# Patient Record
Sex: Female | Born: 1960 | Race: White | Hispanic: No | Marital: Married | Smoking: Never smoker
Health system: Southern US, Community
[De-identification: ages and names within clinical notes are randomized; demographics above are authoritative.]

## PROBLEM LIST (undated history)

## (undated) DIAGNOSIS — I10 Essential (primary) hypertension: Secondary | ICD-10-CM

## (undated) DIAGNOSIS — K589 Irritable bowel syndrome without diarrhea: Secondary | ICD-10-CM

## (undated) DIAGNOSIS — L0390 Cellulitis, unspecified: Secondary | ICD-10-CM

## (undated) DIAGNOSIS — I499 Cardiac arrhythmia, unspecified: Secondary | ICD-10-CM

## (undated) DIAGNOSIS — M797 Fibromyalgia: Secondary | ICD-10-CM

## (undated) DIAGNOSIS — N3281 Overactive bladder: Secondary | ICD-10-CM

## (undated) DIAGNOSIS — G4730 Sleep apnea, unspecified: Secondary | ICD-10-CM

## (undated) DIAGNOSIS — K219 Gastro-esophageal reflux disease without esophagitis: Secondary | ICD-10-CM

## (undated) HISTORY — PX: VEIN SURGERY: SHX48

## (undated) HISTORY — PX: POLYPECTOMY: SHX149

## (undated) HISTORY — PX: DILATION AND CURETTAGE OF UTERUS: SHX78

## (undated) HISTORY — PX: FRACTURE SURGERY: SHX138

## (undated) HISTORY — PX: NOVASURE ABLATION: SHX5394

---

## 2004-05-16 ENCOUNTER — Ambulatory Visit (HOSPITAL_COMMUNITY): Admission: RE | Admit: 2004-05-16 | Discharge: 2004-05-16 | Payer: Self-pay | Admitting: Family Medicine

## 2006-08-31 ENCOUNTER — Ambulatory Visit: Payer: Self-pay | Admitting: Cardiovascular Disease

## 2006-09-03 ENCOUNTER — Ambulatory Visit: Payer: Self-pay

## 2008-09-26 ENCOUNTER — Ambulatory Visit (HOSPITAL_COMMUNITY): Admission: RE | Admit: 2008-09-26 | Discharge: 2008-09-26 | Payer: Self-pay | Admitting: Family Medicine

## 2009-06-14 ENCOUNTER — Ambulatory Visit: Payer: Self-pay | Admitting: Licensed Clinical Social Worker

## 2009-07-05 ENCOUNTER — Emergency Department (HOSPITAL_COMMUNITY): Admission: EM | Admit: 2009-07-05 | Discharge: 2009-07-05 | Payer: Self-pay | Admitting: Emergency Medicine

## 2010-01-12 ENCOUNTER — Emergency Department (HOSPITAL_COMMUNITY): Admission: EM | Admit: 2010-01-12 | Discharge: 2010-01-12 | Payer: Self-pay | Admitting: Emergency Medicine

## 2010-01-16 ENCOUNTER — Telehealth (INDEPENDENT_AMBULATORY_CARE_PROVIDER_SITE_OTHER): Payer: Self-pay | Admitting: *Deleted

## 2010-06-25 ENCOUNTER — Ambulatory Visit (HOSPITAL_COMMUNITY): Admission: RE | Admit: 2010-06-25 | Discharge: 2010-06-25 | Payer: Self-pay | Admitting: Family Medicine

## 2010-11-16 ENCOUNTER — Encounter: Payer: Self-pay | Admitting: Family Medicine

## 2010-11-26 NOTE — Progress Notes (Signed)
  Faxed Lov,Stress over to [facility]/ [facility] to [phone] [facility]  January 16, 2010 1:10 PM

## 2011-01-30 LAB — DIFFERENTIAL
Eosinophils Relative: 2 % (ref 0–5)
Lymphocytes Relative: 34 % (ref 12–46)
Lymphs Abs: 2.6 10*3/uL (ref 0.7–4.0)
Monocytes Relative: 6 % (ref 3–12)
Neutrophils Relative %: 59 % (ref 43–77)

## 2011-01-30 LAB — COMPREHENSIVE METABOLIC PANEL
ALT: 21 U/L (ref 0–35)
AST: 24 U/L (ref 0–37)
Albumin: 3.7 g/dL (ref 3.5–5.2)
Alkaline Phosphatase: 46 U/L (ref 39–117)
BUN: 8 mg/dL (ref 6–23)
GFR calc non Af Amer: >60 mL/min (ref >60)
Total Bilirubin: 0.3 mg/dL (ref 0.3–1.2)

## 2011-01-30 LAB — CBC
MCHC: 34.2 g/dL (ref 30.0–36.0)
Platelets: 237 10*3/uL (ref 150–400)
WBC: 7.7 10*3/uL (ref 4.0–10.5)

## 2011-01-30 LAB — POCT CARDIAC MARKERS
CKMB, poc: <1 ng/mL — ABNORMAL LOW (ref 1.0–8.0)
Myoglobin, poc: 92.6 ng/mL (ref 12–200)
Troponin i, poc: <0.05 ng/mL (ref 0.00–0.09)

## 2011-03-13 NOTE — H&P (Signed)
[facility] ADMISSION   Lisa Braun, Lisa Braun                      MRN:          [id number]  DATE:08/31/2006                            DOB:          1961/05/08    Lisa Braun is seen today in as a new consult.  She was referred by Dr. Morrie Sheldon from  [facility].  She is 51 years old.  I took care of  her father 4-5 years ago.  He had coronary artery bypass surgery and died of  a myocardial infarction.   The patient is referred for chest pain.   She had a severe episode a week ago Monday.  It was nonexertional.  It  resolved on its own.  There was some radiation to both arms and her neck.  She can get atypical chest pain from time-to-time.  She does not get  reproducible exertional pain.  She does have fibromyalgia, but this tends to  worsen in her arms and legs, more than anything else.  Her cardiac risk  factors include positive family history and smoking.  She smokes about a  pack a day.  She is a nondiabetic.  I do not know her cholesterol status.   REVIEW OF SYSTEMS:  Remarkable for no pleurisy, no cough, and no sputum.  No  previous trauma and no syncope.  She does get occasional palpitations.  Other review of systems are remarkable for a small amount of menstrual  bleeding.  She may be going through menopause.  She has reflux.  She has  general fatigue from her fibromyalgia.   Her mother died at age 26 of a stroke.  Her father died of renal failure and  diabetes, but had significant heart problems.  She takes Ultram and  Ditropan.  She has recently been on Bactrim for cellulitis and lower  extremity edema.   She does take an aspirin a day and p.r.n. Tylenol.   Of note, the patient does not have health insurance.  She has been married  for 10 years.  There are two children living in the house who are good kids  and from previous marriages on both sides.  The patient works at [employer]  and  enjoys raising boxers.   ALLERGIES:  She has no significant allergies.   PHYSICAL EXAMINATION:  SKIN:  Warm and dry.  HEENT:  Normal.  There is no lymphadenopathy.  There is no thyromegaly.  LUNGS:  Clear.  Carotids are normal.  There is an S1-S2, normal heart sounds.  ABDOMEN:  Benign.  Lower extremity intact pulses.  No edema.  NEUROLOGIC:  Nonfocal.   EKG is essentially normal, previously, in Dr. Nelly Laurence office.  I suspect that  they had her chest leads too high because the EKG read previous anterior  wall infarction.   IMPRESSION:  Atypical chest pain in a long-term smoker.  I had a long  discussion with the patient.  Because she does not have health insurance we  would like to minimize cost.  Unfortunately she has some nonspecific ST-T  wave  changes on her EKG and being a fairly young woman I think the risk of  an abnormal treadmill is quite high, plus because of her fibromyalgia I do  not think that she could walk on the treadmill well.  She is willing to pay  out of pocket; and, I think, the cheapest test would be an adenosine only  Myoview study.  We will set this up and I will see her back in 2 months.   I told her to take an aspirin a day.  She was offered Chantex medicine, but  again, because of cost reasons could not afford it; and I believe that she  has been on Wellbutrin in the past and failed.  I will see her back in about  2 months unless her stress test is abnormal.    ______________________________  Noralyn Pick. Eden Emms, MD, [facility]    PCN/MedQ  DD: 08/31/2006  DT: 08/31/2006  Job #: [id number]   cc:   Alfredia Client, MD

## 2013-09-29 ENCOUNTER — Other Ambulatory Visit: Payer: Self-pay | Admitting: Obstetrics

## 2013-10-12 ENCOUNTER — Encounter (HOSPITAL_COMMUNITY): Payer: Self-pay

## 2013-10-13 ENCOUNTER — Other Ambulatory Visit (HOSPITAL_COMMUNITY): Payer: 59

## 2013-10-16 ENCOUNTER — Encounter (HOSPITAL_COMMUNITY): Payer: Self-pay

## 2013-10-16 ENCOUNTER — Encounter (HOSPITAL_COMMUNITY)
Admission: RE | Admit: 2013-10-16 | Discharge: 2013-10-16 | Disposition: A | Discharge status: Home / Self Care | Payer: 59 | Source: Ambulatory Visit | Attending: Obstetrics | Admitting: Obstetrics

## 2013-10-16 DIAGNOSIS — Z01812 Encounter for preprocedural laboratory examination: Secondary | ICD-10-CM | POA: Insufficient documentation

## 2013-10-16 HISTORY — DX: Irritable bowel syndrome, unspecified: K58.9

## 2013-10-16 HISTORY — DX: Fibromyalgia: M79.7

## 2013-10-16 HISTORY — DX: Cardiac arrhythmia, unspecified: I49.9

## 2013-10-16 HISTORY — DX: Gastro-esophageal reflux disease without esophagitis: K21.9

## 2013-10-16 HISTORY — DX: Cellulitis, unspecified: L03.90

## 2013-10-16 HISTORY — DX: Overactive bladder: N32.81

## 2013-10-16 HISTORY — DX: Essential (primary) hypertension: I10

## 2013-10-16 LAB — COMPREHENSIVE METABOLIC PANEL
ALT: 40 U/L — ABNORMAL HIGH (ref 0–35)
AST: 31 U/L (ref 0–37)
Albumin: 3.7 g/dL (ref 3.5–5.2)
CO2: 31 mEq/L (ref 19–32)
Chloride: 100 mEq/L (ref 96–112)
GFR calc non Af Amer: >90 mL/min (ref >90)
Potassium: 3.8 mEq/L (ref 3.5–5.1)
Sodium: 137 mEq/L (ref 135–145)
Total Bilirubin: 0.3 mg/dL (ref 0.3–1.2)

## 2013-10-16 LAB — TYPE AND SCREEN: ABO/RH(D): B POS

## 2013-10-16 LAB — CBC
MCH: 29.4 pg (ref 26.0–34.0)
MCV: 90.2 fL (ref 78.0–100.0)
WBC: 5.6 10*3/uL (ref 4.0–10.5)

## 2013-10-16 LAB — ABO/RH: ABO/RH(D): B POS

## 2013-10-16 NOTE — Patient Instructions (Signed)
Your procedure is scheduled on: 10/25/2013  Enter through the Main Entrance of [facility] at: 1030AM  Pick up the phone at the desk and dial [phone].  Call this number if you have problems the morning of surgery: [phone].  Remember: Do NOT eat food: AFTER MIDNIGHT TUESDAY Do NOT drink clear liquids after:  AFTER MIDNIGHT TUESDAY Take these medicines the morning of surgery with a SIP OF WATER: HYDROCHLOROTHIAZIDE, PRILOSEC  Do NOT wear jewelry (body piercing), make-up, or nail polish. Do NOT wear lotions, powders, or perfumes.  You may wear deoderant. Do NOT shave for 48 hours prior to surgery. Do NOT bring valuables to the hospital. Contacts, dentures, or bridgework may not be worn into surgery. Leave suitcase in car.  After surgery it may be brought to your room.  For patients admitted to the hospital, checkout time is 11:00 AM the day of discharge.

## 2013-10-23 ENCOUNTER — Ambulatory Visit: Admit: 2013-10-23 | Payer: Self-pay | Admitting: Obstetrics and Gynecology

## 2013-10-23 SURGERY — HYSTERECTOMY, VAGINAL, LAPAROSCOPY-ASSISTED
Anesthesia: General

## 2013-10-24 ENCOUNTER — Other Ambulatory Visit (HOSPITAL_COMMUNITY): Payer: Self-pay | Admitting: Obstetrics

## 2013-10-24 DIAGNOSIS — Z1231 Encounter for screening mammogram for malignant neoplasm of breast: Secondary | ICD-10-CM

## 2013-10-25 ENCOUNTER — Encounter (HOSPITAL_COMMUNITY)
Admission: RE | Disposition: A | Discharge status: Home / Self Care | Payer: Self-pay | Source: Ambulatory Visit | Attending: Obstetrics

## 2013-10-25 ENCOUNTER — Ambulatory Visit (HOSPITAL_COMMUNITY): Payer: 59 | Admitting: Certified Registered"

## 2013-10-25 ENCOUNTER — Encounter (HOSPITAL_COMMUNITY): Payer: Self-pay | Admitting: Certified Registered"

## 2013-10-25 ENCOUNTER — Ambulatory Visit (HOSPITAL_COMMUNITY)
Admission: RE | Admit: 2013-10-25 | Discharge: 2013-10-26 | Disposition: A | Discharge status: Home / Self Care | Payer: 59 | Source: Ambulatory Visit | Attending: Obstetrics | Admitting: Obstetrics

## 2013-10-25 ENCOUNTER — Encounter (HOSPITAL_COMMUNITY): Payer: 59 | Admitting: Certified Registered"

## 2013-10-25 DIAGNOSIS — N938 Other specified abnormal uterine and vaginal bleeding: Secondary | ICD-10-CM | POA: Insufficient documentation

## 2013-10-25 DIAGNOSIS — N949 Unspecified condition associated with female genital organs and menstrual cycle: Secondary | ICD-10-CM | POA: Insufficient documentation

## 2013-10-25 DIAGNOSIS — Z90710 Acquired absence of both cervix and uterus: Secondary | ICD-10-CM | POA: Diagnosis present

## 2013-10-25 DIAGNOSIS — Z8041 Family history of malignant neoplasm of ovary: Secondary | ICD-10-CM | POA: Insufficient documentation

## 2013-10-25 DIAGNOSIS — IMO0001 Reserved for inherently not codable concepts without codable children: Secondary | ICD-10-CM | POA: Insufficient documentation

## 2013-10-25 HISTORY — PX: ROBOTIC ASSISTED TOTAL HYSTERECTOMY WITH BILATERAL SALPINGO OOPHERECTOMY: SHX6086

## 2013-10-25 HISTORY — PX: LAPAROSCOPIC ASSISTED VAGINAL HYSTERECTOMY: SHX5398

## 2013-10-25 LAB — TYPE AND SCREEN
ABO/RH(D): B POS
Antibody Screen: NEGATIVE

## 2013-10-25 SURGERY — ROBOTIC ASSISTED TOTAL HYSTERECTOMY WITH BILATERAL SALPINGO OOPHORECTOMY
Anesthesia: General

## 2013-10-25 MED ORDER — DEXAMETHASONE SODIUM PHOSPHATE 10 MG/ML IJ SOLN
INTRAMUSCULAR | Status: AC
  Filled 2013-10-25: qty 1

## 2013-10-25 MED ORDER — CEFAZOLIN SODIUM-DEXTROSE 2-3 GM-% IV SOLR: 2.0000 g | INTRAVENOUS | Status: DC

## 2013-10-25 MED ORDER — SODIUM CHLORIDE 0.9 % IJ SOLN
INTRAMUSCULAR | Status: AC
  Filled 2013-10-25: qty 10

## 2013-10-25 MED ORDER — GLYCOPYRROLATE 0.2 MG/ML IJ SOLN
INTRAMUSCULAR | Status: AC
  Filled 2013-10-25: qty 3

## 2013-10-25 MED ORDER — MEPERIDINE HCL 25 MG/ML IJ SOLN: 6.2500 mg | INTRAMUSCULAR | Status: DC | PRN

## 2013-10-25 MED ORDER — ONDANSETRON HCL 4 MG/2ML IJ SOLN: 4.0000 mg | Freq: Four times a day (QID) | INTRAMUSCULAR | Status: DC | PRN

## 2013-10-25 MED ORDER — BUPIVACAINE HCL (PF) 0.25 % IJ SOLN
INTRAMUSCULAR | Status: DC | PRN
  Administered 2013-10-25: 30 mL

## 2013-10-25 MED ORDER — IBUPROFEN 600 MG PO TABS: 600.0000 mg | ORAL_TABLET | Freq: Four times a day (QID) | ORAL | Status: DC | PRN

## 2013-10-25 MED ORDER — LIDOCAINE HCL (CARDIAC) 20 MG/ML IV SOLN
INTRAVENOUS | Status: AC
  Filled 2013-10-25: qty 5

## 2013-10-25 MED ORDER — NEOSTIGMINE METHYLSULFATE 1 MG/ML IJ SOLN
INTRAMUSCULAR | Status: DC | PRN
  Administered 2013-10-25: 3 mg via INTRAVENOUS

## 2013-10-25 MED ORDER — PROPOFOL 10 MG/ML IV BOLUS
INTRAVENOUS | Status: DC | PRN
  Administered 2013-10-25: 180 mg via INTRAVENOUS
  Administered 2013-10-25 (×2): 50 mg via INTRAVENOUS

## 2013-10-25 MED ORDER — MIDAZOLAM HCL 2 MG/2ML IJ SOLN
INTRAMUSCULAR | Status: DC | PRN
  Administered 2013-10-25: 2 mg via INTRAVENOUS

## 2013-10-25 MED ORDER — HYDROMORPHONE HCL PF 1 MG/ML IJ SOLN: 1.0000 mg | INTRAMUSCULAR | Status: DC | PRN

## 2013-10-25 MED ORDER — ROPIVACAINE HCL 5 MG/ML IJ SOLN
INTRAMUSCULAR | Status: AC
  Filled 2013-10-25: qty 30

## 2013-10-25 MED ORDER — GLYCOPYRROLATE 0.2 MG/ML IJ SOLN
INTRAMUSCULAR | Status: DC | PRN
  Administered 2013-10-25: .4 mg via INTRAVENOUS

## 2013-10-25 MED ORDER — SODIUM CHLORIDE 0.9 % IJ SOLN
INTRAMUSCULAR | Status: DC | PRN
  Administered 2013-10-25: 60 mL via INTRAVENOUS

## 2013-10-25 MED ORDER — ZOLPIDEM TARTRATE 5 MG PO TABS: 5.0000 mg | ORAL_TABLET | Freq: Every evening | ORAL | Status: DC | PRN

## 2013-10-25 MED ORDER — LIDOCAINE-EPINEPHRINE 0.5 %-1:200000 IJ SOLN
INTRAMUSCULAR | Status: AC
  Filled 2013-10-25: qty 1

## 2013-10-25 MED ORDER — MENTHOL 3 MG MT LOZG: 1.0000 | LOZENGE | OROMUCOSAL | Status: DC | PRN

## 2013-10-25 MED ORDER — PANTOPRAZOLE SODIUM 40 MG PO TBEC
40.0000 mg | DELAYED_RELEASE_TABLET | Freq: Every day | ORAL | Status: DC
  Administered 2013-10-26: 40 mg via ORAL
  Filled 2013-10-25 (×2): qty 1

## 2013-10-25 MED ORDER — ROCURONIUM BROMIDE 100 MG/10ML IV SOLN
INTRAVENOUS | Status: DC | PRN
  Administered 2013-10-25: 10 mg via INTRAVENOUS
  Administered 2013-10-25: 20 mg via INTRAVENOUS
  Administered 2013-10-25: 50 mg via INTRAVENOUS
  Administered 2013-10-25 (×2): 10 mg via INTRAVENOUS

## 2013-10-25 MED ORDER — ROCURONIUM BROMIDE 100 MG/10ML IV SOLN
INTRAVENOUS | Status: AC
  Filled 2013-10-25: qty 1

## 2013-10-25 MED ORDER — ONDANSETRON HCL 4 MG/2ML IJ SOLN
INTRAMUSCULAR | Status: DC | PRN
  Administered 2013-10-25: 4 mg via INTRAVENOUS

## 2013-10-25 MED ORDER — GENTAMICIN SULFATE 40 MG/ML IJ SOLN
Freq: Once | INTRAVENOUS | Status: AC
  Administered 2013-10-25: 100 mL via INTRAVENOUS
  Filled 2013-10-25: qty 8.75

## 2013-10-25 MED ORDER — GENTAMICIN SULFATE 40 MG/ML IJ SOLN: 5.0000 mg/kg | Freq: Once | INTRAVENOUS | Status: DC

## 2013-10-25 MED ORDER — OXYBUTYNIN CHLORIDE 5 MG PO TABS
5.0000 mg | ORAL_TABLET | Freq: Two times a day (BID) | ORAL | Status: DC
  Filled 2013-10-25 (×4): qty 1

## 2013-10-25 MED ORDER — BUPIVACAINE HCL (PF) 0.25 % IJ SOLN
INTRAMUSCULAR | Status: AC
  Filled 2013-10-25: qty 60

## 2013-10-25 MED ORDER — ONDANSETRON HCL 4 MG/2ML IJ SOLN
INTRAMUSCULAR | Status: AC
  Filled 2013-10-25: qty 2

## 2013-10-25 MED ORDER — KETOROLAC TROMETHAMINE 30 MG/ML IJ SOLN: 15.0000 mg | Freq: Once | INTRAMUSCULAR | Status: DC | PRN

## 2013-10-25 MED ORDER — OXYCODONE-ACETAMINOPHEN 5-325 MG PO TABS
1.0000 | ORAL_TABLET | ORAL | Status: DC | PRN
  Administered 2013-10-25 (×2): 1 via ORAL
  Administered 2013-10-26 (×3): 2 via ORAL
  Filled 2013-10-25 (×3): qty 2
  Filled 2013-10-25 (×2): qty 1

## 2013-10-25 MED ORDER — PROPOFOL 10 MG/ML IV EMUL
INTRAVENOUS | Status: AC
  Filled 2013-10-25: qty 20

## 2013-10-25 MED ORDER — RINGERS IRRIGATION IR SOLN
Status: DC | PRN
  Administered 2013-10-25: 1

## 2013-10-25 MED ORDER — LACTATED RINGERS IV SOLN
INTRAVENOUS | Status: DC
  Administered 2013-10-25 (×2): via INTRAVENOUS

## 2013-10-25 MED ORDER — SODIUM CHLORIDE 0.9 % IV SOLN
INTRAVENOUS | Status: DC
  Administered 2013-10-25 – 2013-10-26 (×2): via INTRAVENOUS

## 2013-10-25 MED ORDER — HYDROMORPHONE HCL PF 1 MG/ML IJ SOLN
INTRAMUSCULAR | Status: DC | PRN
  Administered 2013-10-25: 1 mg via INTRAVENOUS

## 2013-10-25 MED ORDER — FENTANYL CITRATE 0.05 MG/ML IJ SOLN
INTRAMUSCULAR | Status: AC
  Filled 2013-10-25: qty 5

## 2013-10-25 MED ORDER — SODIUM CHLORIDE 0.9 % IJ SOLN
INTRAMUSCULAR | Status: AC
  Filled 2013-10-25: qty 50

## 2013-10-25 MED ORDER — PROMETHAZINE HCL 25 MG/ML IJ SOLN: 6.2500 mg | INTRAMUSCULAR | Status: DC | PRN

## 2013-10-25 MED ORDER — LIDOCAINE HCL (CARDIAC) 20 MG/ML IV SOLN
INTRAVENOUS | Status: DC | PRN
  Administered 2013-10-25: 80 mg via INTRAVENOUS

## 2013-10-25 MED ORDER — KETOROLAC TROMETHAMINE 30 MG/ML IJ SOLN
INTRAMUSCULAR | Status: AC
  Filled 2013-10-25: qty 1

## 2013-10-25 MED ORDER — DEXAMETHASONE SODIUM PHOSPHATE 10 MG/ML IJ SOLN
INTRAMUSCULAR | Status: DC | PRN
  Administered 2013-10-25: 10 mg via INTRAVENOUS

## 2013-10-25 MED ORDER — CLINDAMYCIN PHOSPHATE 900 MG/50ML IV SOLN: 900.0000 mg | Freq: Once | INTRAVENOUS | Status: DC

## 2013-10-25 MED ORDER — FENTANYL CITRATE 0.05 MG/ML IJ SOLN: 25.0000 ug | INTRAMUSCULAR | Status: DC | PRN

## 2013-10-25 MED ORDER — ROPIVACAINE HCL 5 MG/ML IJ SOLN
INTRAMUSCULAR | Status: DC | PRN
  Administered 2013-10-25: 10 mL via EPIDURAL

## 2013-10-25 MED ORDER — FENTANYL CITRATE 0.05 MG/ML IJ SOLN
INTRAMUSCULAR | Status: DC | PRN
  Administered 2013-10-25 (×5): 50 ug via INTRAVENOUS

## 2013-10-25 MED ORDER — PANTOPRAZOLE SODIUM 40 MG PO TBEC: 40.0000 mg | DELAYED_RELEASE_TABLET | Freq: Every day | ORAL | Status: DC

## 2013-10-25 MED ORDER — MIDAZOLAM HCL 2 MG/2ML IJ SOLN
INTRAMUSCULAR | Status: AC
  Filled 2013-10-25: qty 2

## 2013-10-25 MED ORDER — HEPARIN SODIUM (PORCINE) 5000 UNIT/ML IJ SOLN
INTRAMUSCULAR | Status: AC
  Filled 2013-10-25: qty 1

## 2013-10-25 MED ORDER — MIDAZOLAM HCL 2 MG/2ML IJ SOLN: 0.5000 mg | Freq: Once | INTRAMUSCULAR | Status: DC | PRN

## 2013-10-25 MED ORDER — ONDANSETRON HCL 4 MG PO TABS: 4.0000 mg | ORAL_TABLET | Freq: Four times a day (QID) | ORAL | Status: DC | PRN

## 2013-10-25 MED ORDER — HYDROMORPHONE HCL PF 1 MG/ML IJ SOLN
INTRAMUSCULAR | Status: AC
  Filled 2013-10-25: qty 1

## 2013-10-25 MED ORDER — LIDOCAINE-EPINEPHRINE 0.5 %-1:200000 IJ SOLN
INTRAMUSCULAR | Status: DC | PRN
  Administered 2013-10-25: 50 mL

## 2013-10-25 MED ORDER — NEOSTIGMINE METHYLSULFATE 1 MG/ML IJ SOLN
INTRAMUSCULAR | Status: AC
  Filled 2013-10-25: qty 1

## 2013-10-25 SURGICAL SUPPLY — 84 items
ADH SKN CLS APL DERMABOND .7 (GAUZE/BANDAGES/DRESSINGS) ×4
BAG URINE DRAINAGE (UROLOGICAL SUPPLIES) ×3 IMPLANT
BARRIER ADHS 3X4 INTERCEED (GAUZE/BANDAGES/DRESSINGS) ×3 IMPLANT
BRR ADH 4X3 ABS CNTRL BYND (GAUZE/BANDAGES/DRESSINGS) ×2
CABLE HIGH FREQUENCY MONO STRZ (ELECTRODE) IMPLANT
CATH FOLEY 3WAY  5CC 16FR (CATHETERS) ×1
CATH FOLEY 3WAY 5CC 16FR (CATHETERS) ×2 IMPLANT
CHLORAPREP W/TINT 26ML (MISCELLANEOUS) ×5 IMPLANT
CLOTH BEACON ORANGE TIMEOUT ST (SAFETY) ×3 IMPLANT
CONT PATH 16OZ SNAP LID 3702 (MISCELLANEOUS) ×3 IMPLANT
COVER MAYO STAND STRL (DRAPES) ×3 IMPLANT
COVER TABLE BACK 60X90 (DRAPES) ×6 IMPLANT
COVER TIP SHEARS 8 DVNC (MISCELLANEOUS) ×2 IMPLANT
COVER TIP SHEARS 8MM DA VINCI (MISCELLANEOUS) ×1
DECANTER SPIKE VIAL GLASS SM (MISCELLANEOUS) ×4 IMPLANT
DERMABOND ADVANCED (GAUZE/BANDAGES/DRESSINGS) ×2
DERMABOND ADVANCED .7 DNX12 (GAUZE/BANDAGES/DRESSINGS) ×4 IMPLANT
DRAPE HUG U DISPOSABLE (DRAPE) ×3 IMPLANT
DRAPE LG THREE QUARTER DISP (DRAPES) ×6 IMPLANT
DRAPE WARM FLUID 44X44 (DRAPE) ×3 IMPLANT
ELECT REM PT RETURN 9FT ADLT (ELECTROSURGICAL) ×3
ELECTRODE REM PT RTRN 9FT ADLT (ELECTROSURGICAL) ×2 IMPLANT
EVACUATOR SMOKE 8.L (FILTER) ×3 IMPLANT
GAUZE PACKING IODOFORM 2 (PACKING) IMPLANT
GAUZE VASELINE 3X9 (GAUZE/BANDAGES/DRESSINGS) IMPLANT
GLOVE BIO SURGEON STRL SZ 6.5 (GLOVE) ×3 IMPLANT
GLOVE BIOGEL PI IND STRL 6.5 (GLOVE) ×2 IMPLANT
GLOVE BIOGEL PI IND STRL 7.0 (GLOVE) ×2 IMPLANT
GLOVE BIOGEL PI INDICATOR 6.5 (GLOVE) ×3
GLOVE BIOGEL PI INDICATOR 7.0 (GLOVE) ×1
GOWN STRL REIN XL XLG (GOWN DISPOSABLE) ×18 IMPLANT
HEMOSTAT SURGICEL 2X14 (HEMOSTASIS) ×1 IMPLANT
IV STOPCOCK 4 WAY 40  W/Y SET (IV SOLUTION)
IV STOPCOCK 4 WAY 40 W/Y SET (IV SOLUTION) ×2 IMPLANT
KIT ACCESSORY DA VINCI DISP (KITS) ×1
KIT ACCESSORY DVNC DISP (KITS) ×2 IMPLANT
LEGGING LITHOTOMY PAIR STRL (DRAPES) ×3 IMPLANT
NEEDLE HYPO 22GX1.5 SAFETY (NEEDLE) IMPLANT
NEEDLE INSUFFLATION 120MM (ENDOMECHANICALS) IMPLANT
NS IRRIG 1000ML POUR BTL (IV SOLUTION) ×3 IMPLANT
OCCLUDER COLPOPNEUMO (BALLOONS) ×1 IMPLANT
PACK LAVH (CUSTOM PROCEDURE TRAY) ×3 IMPLANT
PAD PREP 24X48 CUFFED NSTRL (MISCELLANEOUS) ×6 IMPLANT
PLUG CATH AND CAP STER (CATHETERS) ×3 IMPLANT
PROTECTOR NERVE ULNAR (MISCELLANEOUS) ×6 IMPLANT
SCALPEL HARMONIC ACE (MISCELLANEOUS) IMPLANT
SCISSORS LAP 5X35 DISP (ENDOMECHANICALS) IMPLANT
SET CYSTO W/LG BORE CLAMP LF (SET/KITS/TRAYS/PACK) IMPLANT
SET IRRIG TUBING LAPAROSCOPIC (IRRIGATION / IRRIGATOR) ×5 IMPLANT
SOLUTION ELECTROLUBE (MISCELLANEOUS) ×3 IMPLANT
SUT VIC AB 0 CT1 18XCR BRD8 (SUTURE) ×4 IMPLANT
SUT VIC AB 0 CT1 27 (SUTURE)
SUT VIC AB 0 CT1 27XBRD ANBCTR (SUTURE) ×4 IMPLANT
SUT VIC AB 0 CT1 27XBRD ANTBC (SUTURE) IMPLANT
SUT VIC AB 0 CT1 8-18 (SUTURE) ×6
SUT VIC AB 0 CT2 27 (SUTURE) IMPLANT
SUT VIC AB 2-0 CT1 27 (SUTURE)
SUT VIC AB 2-0 CT1 TAPERPNT 27 (SUTURE) IMPLANT
SUT VIC AB 4-0 PS2 18 (SUTURE) ×2 IMPLANT
SUT VIC AB 4-0 PS2 27 (SUTURE) ×5 IMPLANT
SUT VICRYL 0 27 CT2 27 ABS (SUTURE) ×2 IMPLANT
SUT VICRYL 0 TIES 12 18 (SUTURE) ×3 IMPLANT
SUT VICRYL 0 UR6 27IN ABS (SUTURE) ×5 IMPLANT
SUT VICRYL 4-0 PS2 18IN ABS (SUTURE) IMPLANT
SYR 50ML LL SCALE MARK (SYRINGE) ×4 IMPLANT
SYR TB 1ML 25GX5/8 (SYRINGE) ×1 IMPLANT
SYSTEM CONVERTIBLE TROCAR (TROCAR) IMPLANT
TIP RUMI ORANGE 6.7MMX12CM (TIP) IMPLANT
TIP UTERINE 5.1X6CM LAV DISP (MISCELLANEOUS) IMPLANT
TIP UTERINE 6.7X10CM GRN DISP (MISCELLANEOUS) IMPLANT
TIP UTERINE 6.7X6CM WHT DISP (MISCELLANEOUS) IMPLANT
TIP UTERINE 6.7X8CM BLUE DISP (MISCELLANEOUS) ×1 IMPLANT
TOWEL OR 17X24 6PK STRL BLUE (TOWEL DISPOSABLE) ×8 IMPLANT
TRAY FOLEY CATH 14FR (SET/KITS/TRAYS/PACK) ×3 IMPLANT
TROCAR 12M 150ML BLUNT (TROCAR) ×1 IMPLANT
TROCAR BALLN 12MMX100 BLUNT (TROCAR) IMPLANT
TROCAR DISP BLADELESS 8 DVNC (TROCAR) ×2 IMPLANT
TROCAR DISP BLADELESS 8MM (TROCAR) ×1
TROCAR XCEL 12X100 BLDLESS (ENDOMECHANICALS) ×3 IMPLANT
TROCAR XCEL NON-BLD 11X100MML (ENDOMECHANICALS) IMPLANT
TROCAR XCEL NON-BLD 5MMX100MML (ENDOMECHANICALS) ×3 IMPLANT
TUBING FILTER THERMOFLATOR (ELECTROSURGICAL) ×5 IMPLANT
WARMER LAPAROSCOPE (MISCELLANEOUS) ×3 IMPLANT
WATER STERILE IRR 1000ML POUR (IV SOLUTION) ×9 IMPLANT

## 2013-10-25 NOTE — Transfer of Care (Signed)
Immediate Anesthesia Transfer of Care Note  Patient: Lisa Braun  Procedure(s) Performed: Procedure(s) with comments: ROBOTIC ASSISTED TOTAL HYSTERECTOMY WITH BILATERAL SALPINGO OOPHORECTOMY (Bilateral) - 3 1/2 hrs. LAPAROSCOPIC ASSISTED VAGINAL HYSTERECTOMY; BSO (N/A)  Patient Location: PACU  Anesthesia Type:General  Level of Consciousness: awake, alert  and oriented  Airway & Oxygen Therapy: Patient Spontanous Breathing and Patient connected to nasal cannula oxygen  Post-op Assessment: Report given to PACU RN and Post -op Vital signs reviewed and stable  Post vital signs: stable  Complications: No apparent anesthesia complications

## 2013-10-25 NOTE — H&P (Signed)
See scanned doc for full H&P  In short, 52 yo G2P2001 with h/o menorrhagia, failed Novasure ablation and desire for definitive management. Also with family history of stage 4 ovarian cancer (mother) so desires BSO. Overall small uterus, nl embx and nl pap (though  H/o LEEP).  PMH: OAB, fibromyalgia  All: rocephin  No new changes from H&P. Nl pre-op testing and nl echo.  A/P: Robotic assited TLH/ BSO Pt aware R/B  Guillaume Weninger A. 10/25/2013 12:02 PM

## 2013-10-25 NOTE — Brief Op Note (Signed)
10/25/2013  4:25 PM  PATIENT:  Lisa Braun  52 y.o. female  PRE-OPERATIVE DIAGNOSIS:  Dysfunctional Uterine Bleeding  16109, FH ovarian cancer, failed ablation  POST-OPERATIVE DIAGNOSIS:  Dysfunctional Uterine Bleeding 60454, FH ovarian cancer, failed ablation, pelvic adhesions   PROCEDURE:  Robotic assisted total laprascopic hysterectomy with BSO and pelvic washings, lysis of adhesions  SURGEON:  Surgeon(s) and Role:    * Freddrick Gladson A. Ernestina Penna, MD - Primary    * Robley Fries, MD - Assisting  PHYSICIAN ASSISTANT:   ASSISTANTSJuliene Pina, MD   ANESTHESIA:   local and general  EBL:  Total I/O In: 1700 [I.V.:1700] Out: 500 [Urine:400; Blood:100]  BLOOD ADMINISTERED:none  DRAINS: Urinary Catheter (Foley)   LOCAL MEDICATIONS USED:  BUPIVICAINE   SPECIMEN:  Source of Specimen:  uterus, cervix, bilateral tubes and ovaries, pelvic washings  DISPOSITION OF SPECIMEN:  PATHOLOGY  COUNTS:  YES  TOURNIQUET:  * No tourniquets in log *  DICTATION: .Note written in EPIC  PLAN OF CARE: Admit for overnight observation  PATIENT DISPOSITION:  PACU - hemodynamically stable.   Delay start of Pharmacological VTE agent (>24hrs) due to surgical blood loss or risk of bleeding: yes

## 2013-10-25 NOTE — Preoperative (Signed)
Beta Blockers   Reason not to administer Beta Blockers:Not Applicable 

## 2013-10-25 NOTE — Op Note (Signed)
10/25/2013  4:25 PM  PATIENT:  Lisa Braun  52 y.o. female  PRE-OPERATIVE DIAGNOSIS:  Dysfunctional Uterine Bleeding  62130, FH ovarian cancer, failed ablation  POST-OPERATIVE DIAGNOSIS:  Dysfunctional Uterine Bleeding 86578, FH ovarian cancer, failed ablation, pelvic adhesions   PROCEDURE:  Robotic assisted total laprascopic hysterectomy with BSO and pelvic washings, lysis of adhesions  SURGEON:  Surgeon(s) and Role:    * Stellan Vick A. Ernestina Penna, MD - Primary    * Robley Fries, MD - Assisting  PHYSICIAN ASSISTANT:   ASSISTANTSJuliene Pina, MD   ANESTHESIA:   local and general  EBL:  Total I/O In: 1700 [I.V.:1700] Out: 500 [Urine:400; Blood:100]  BLOOD ADMINISTERED:none  DRAINS: Urinary Catheter (Foley)   LOCAL MEDICATIONS USED:  BUPIVICAINE   SPECIMEN:  Source of Specimen:  uterus, cervix, bilateral tubes and ovaries, pelvic washings  DISPOSITION OF SPECIMEN:  PATHOLOGY  COUNTS:  YES  TOURNIQUET:  * No tourniquets in log *  DICTATION: .Note written in EPIC  PLAN OF CARE: Admit for overnight observation  PATIENT DISPOSITION:  PACU - hemodynamically stable.   Delay start of Pharmacological VTE agent (>24hrs) due to surgical blood loss or risk of bleeding: yes  Findings: nl liver edge, nl gallbladder, nl appendix, nl omentum, nl b/l tubes and ovaries, bladder adhered to uterus anteriorly, rectum adhered to uterus posteriorly, pelvic adhesion around L adnexa. Nl R ureter, non-visualization of L ureter.  Complications: none Abx: Gentamicin/ Clindamycin  Indications: This is a 52 yo G2P2 with persistent abnormal bleeding and failed uterine ablation. Also with FH of ovarian cancer opting for BSO.   Procedure: After informed consent and discussion of alternatives to hysterectomy, the patient was taken to the operating room where general anesthesia was initiated without difficulty. She was prepped and draped in normal sterile fashion in the dorsal supine lithotomy position.  A Foley catheter was inserted sterilely into the bladder. A bimanual examination was done to assess the size and position of the uterus. A weighted speculum was placed in the vagina. A vaginal wall retractor was used on the anterior vagina and  the cervix was grasped with tenaculum. 10 cc of bupivicaine was used as a paracervical block at 5 and 7 o'clock. The cervix was sounded with the uterine sound. It sounded to 7 cm. The cervix was assessed to identify the Rumi-Co size.A medium cup and an 8cm shaft was used. This was easily threaded through the cervix, into the uterus and the cup seated nicely around the cervix. The uterine balloon was inflated.  Gloved were changed. Attention was then turned to the patient's abdomen. Bupivicaine was used prior to all incision. A total of 10 cc of bupivcaine was used.  A 10 mm incision was made in the umbilicus and blunt and sharp dissection was done until the fascia was identified. This was then grasped with Kocher clamps x2 and entered sharply. A pursestring suture of 0 Vicryl was then placed along the incision and a non-bladed Roseanne Reno was inserted into the peritoneal cavity. Intraperitoneal placement was confirmed with the use of the camera and pneumoperitoneum was created to 15 mm of mercury. The pursestring suture was secured around the port and pneumoperitoneum was maintained. Brief survey of the abdomen and pelvis was done with findings as above. The abdominal wall was assessed and additional port sites were marked. 8 mm incisions were placed in the right (two) and left lower quadrants (1) and non-bladed ports were placed under direct visualization. An additional 5 mm incision was  made in LLQ and 5mm non-bladed port was placed under direct visualization.  The robot was brought to the patient's side and attached with the right side docking. The robotic instruments were placed under direct visualization until proper placement just over the uterus.  I then went to the  robotic console. Brief survey of the patient's abdomen and pelvis revealed  findings as above.  Adhesions noted between bladder and uterus; rectum and uterus; and bowel to L adnexa. R ureter was seen peristalsis low in the pelvis. The left ureter was not apparent. Sharp and blunt dissection was done to release the bowel from the L pelvic side wall. Ureter still not seen. Dissection of the L retroperitoneal space did not reveal the ureter. The posterior peritoneum on the left was dissected and separated to further drop the ureter away from the operative site. The bladder was released from the anterior uterus with blunt and sharp dissection. The left round ligament was serially grasped, cauterized with the PK and sharply dissected. This allowed additional release of the bladder anteriorly and dissection of the retroperitoneal space. The posterior peritoneum was dissected. The ureter was still not visualized. Staying just below the ovary, the left  IP was serially cauterized and transected until the tube and ovary were free.   The  Right round ligament then the right  utero-ovarian ligament was serially cauterized with the bipolar PK and transected with the monopolar scissors. The anterior leaf of the broad ligament was dissected bluntly then monopolar cautery was used to separate the anterior and posterior leafs of the broad ligament. This was carried down through to the bladder flap. During this dissection, brisk bleeding was noted from the L side and the L side was then re-evaluated. About 20 min were spent gaining hemostasis on the L side. Bleeding was stemming mostly from branches of the left uterine artery and was controlled with bipolar cautery.  The right uterine artery was serially cauterized with the PK and transected with the monopolar scissors. The left uterine artery and then serially cauterized with the PK and transected with the monopolar scissors. The uterus was placed on stretch to allow better  visualization of the arteries. The bladder flap was further taken down both sharply and with cautery. The rectum was dissected off the posterior uterus both bluntly and sharply.  At this point with the pressure on the Rumi, the anterior colpotomy was made with the monopolar scissors. This was carried around to the patient's right then left side. The uterus was then positioned anteriorly to allow easy access to the posterior colpotomy. This was carried out with the monopolar scissors staying clear of the adhesed rectum.  Good hemostasis was noted along the angles of the incision.  The uterus, cervix and b/l tubes were removed from the vagina.   Irrigation was used and the vaginal cuff appeared hemostatic. An 9 inch V lock suture was then placed through the umbilical port and used to close the vaginal cuff in a running fashion.  Good hemostasis was noted. Suction irrigation was carried out and hemostasis was assured along the vaginal cuff. The utero-ovarian ligaments were reassessed also found to be hemostatic. All free fluid was removed from the abdomen. The robotic portion was completed. The robot was undocked.   By standard laparoscopy the pelvis was irrigated and evaluated. The patient was placed in reverse Trendelenburg, all additional fluid was suctioned from the abdomen and pelvis.  the cuff had no active bleeding. A large piece of surgicell was  placed over the cuff and over the dissected area of the left pelvic sidewall.   Under direct visualization the ports were removed. Pneumoperitoneum was released and the umbilical port was removed.  The  pursestring suture at the umbilicus was then closed. No additional fascial incisions were closed due to the small size and the nondilated nature of these incisions. A 4-0 Vicryl was used to close the additional laparoscopic ports sites. Good hemostasis was noted.  Sponge lap and needle counts were correct x3 the patient was woken from general anesthesia having  tolerated the procedure well and taken to the recovery room in a stable fashion.

## 2013-10-25 NOTE — Anesthesia Postprocedure Evaluation (Signed)
  Anesthesia Post-op Note  Anesthesia Post Note  Patient: Lisa Braun  Procedure(s) Performed: Procedure(s) (LRB): ROBOTIC ASSISTED TOTAL HYSTERECTOMY WITH BILATERAL SALPINGO OOPHORECTOMY (Bilateral) LAPAROSCOPIC ASSISTED VAGINAL HYSTERECTOMY; BSO (N/A)  Anesthesia type: General  Patient location: PACU  Post pain: Pain level controlled  Post assessment: Post-op Vital signs reviewed  Last Vitals:  Filed Vitals:   10/25/13 1700  BP: 145/84  Pulse: 97  Temp:   Resp: 21    Post vital signs: Reviewed  Level of consciousness: sedated  Complications: No apparent anesthesia complications

## 2013-10-25 NOTE — Anesthesia Preprocedure Evaluation (Signed)
Anesthesia Evaluation  Patient identified by MRN, date of birth, ID band Patient awake    Reviewed: Allergy & Precautions, H&P , Patient's Chart, lab work & pertinent test results, reviewed documented beta blocker date and time   History of Anesthesia Complications Negative for: history of anesthetic complications  Airway Mallampati: II TM Distance: >3 FB Neck ROM: full    Dental   Pulmonary  breath sounds clear to auscultation        Cardiovascular Exercise Tolerance: Good hypertension, + dysrhythmias Rhythm:regular Rate:Normal     Neuro/Psych CVA    GI/Hepatic GERD-  Controlled,  Endo/Other  Morbid obesity  Renal/GU      Musculoskeletal  (+) Fibromyalgia -  Abdominal   Peds  Hematology   Anesthesia Other Findings   Reproductive/Obstetrics                           Anesthesia Physical Anesthesia Plan  ASA: III  Anesthesia Plan: General ETT   Post-op Pain Management:    Induction:   Airway Management Planned:   Additional Equipment:   Intra-op Plan:   Post-operative Plan:   Informed Consent: I have reviewed the patients History and Physical, chart, labs and discussed the procedure including the risks, benefits and alternatives for the proposed anesthesia with the patient or authorized representative who has indicated his/her understanding and acceptance.   Dental Advisory Given  Plan Discussed with: CRNA and Surgeon  Anesthesia Plan Comments:         Anesthesia Quick Evaluation

## 2013-10-26 ENCOUNTER — Encounter (HOSPITAL_COMMUNITY): Payer: Self-pay | Admitting: Obstetrics

## 2013-10-26 LAB — CBC
HCT: 35 % — ABNORMAL LOW (ref 36.0–46.0)
Hemoglobin: 11.6 g/dL — ABNORMAL LOW (ref 12.0–15.0)
MCH: 29.4 pg (ref 26.0–34.0)
MCHC: 33.1 g/dL (ref 30.0–36.0)
MCV: 88.6 fL (ref 78.0–100.0)
Platelets: 169 10*3/uL (ref 150–400)
RBC: 3.95 MIL/uL (ref 3.87–5.11)
RDW: 13 % (ref 11.5–15.5)
WBC: 7.5 10*3/uL (ref 4.0–10.5)

## 2013-10-26 MED ORDER — OXYCODONE-ACETAMINOPHEN 10-325 MG PO TABS: 1.0000 | ORAL_TABLET | ORAL | Status: DC | PRN

## 2013-10-26 NOTE — Plan of Care (Signed)
Problem: Phase II Progression Outcomes Goal: Pain controlled on oral analgesia Outcome: Completed/Met Date Met:  10/26/13 Pain controlled on po Percocet Goal: Progress activity as tolerated unless otherwise ordered Outcome: Completed/Met Date Met:  10/26/13 Has walked the halls x3 and sat up x3 tonight and tolerated well Goal: Incision/dressings dry and intact Outcome: Completed/Met Date Met:  10/26/13 Lap sites x4 with Derma bond CDI Goal: Remove staples if indicated/incision care Outcome: Not Applicable Date Met:  21/58/72 Lap sites had Derma bond  Problem: Discharge Progression Outcomes Goal: Tolerating diet Outcome: Completed/Met Date Met:  10/26/13 Tolerated Regular diet well Goal: Activity appropriate for discharge plan Outcome: Completed/Met Date Met:  10/26/13 Walked in the halls and tolerated well.

## 2013-10-26 NOTE — Anesthesia Postprocedure Evaluation (Signed)
Anesthesia Post Note  Patient: Lisa Braun  Procedure(s) Performed: Procedure(s) with comments: ROBOTIC ASSISTED TOTAL HYSTERECTOMY WITH BILATERAL SALPINGO OOPHORECTOMY (Bilateral) - 3 1/2 hrs. LAPAROSCOPIC ASSISTED VAGINAL HYSTERECTOMY; BSO (N/A)  Anesthesia type: General  Patient location: [facility]'s Unit  Post pain: Pain level controlled  Post assessment: Post-op Vital signs reviewed  Last Vitals: BP 133/83  Pulse 79  Temp(Src) 36.8 C (Oral)  Resp 18  Ht 5\' 3"  (1.6 m)  Wt 216 lb (97.977 kg)  BMI 38.27 kg/m2  SpO2 98%  Post vital signs: Reviewed  Level of consciousness: awake  Complications: No apparent anesthesia complications

## 2013-10-26 NOTE — Progress Notes (Signed)
Pt is discharged in the care of husband. Denies any pain or discomfort. Discharge instructions were given to pt. Questions were asked and answered.

## 2013-10-26 NOTE — Discharge Summary (Signed)
Sanjuana LettersBetty P Braun MRN: [id number] DOB/AGE: 12/02/1960 53 y.o.  Admit date: 10/25/2013 Discharge date: 10/26/13  Admission Diagnoses: Dysfunctional Uterine Bleeding  [id number]  Discharge Diagnoses: Dysfunctional Uterine Bleeding  [id number]        Active Problems:   S/P laparoscopic hysterectomy with robotic assistance, BSO and pelvic washings  Discharged Condition: good  Hospital Course: Pt admitted and had planned TLH/ BSO w/ robotic assistance and no complication. Gent/ Clinda antibiotics.  On HD#1 pt notes pain well controlled, + flatus, no N/V, tolerated nl dinner and breakfast, good voids, no VB.  Filed Vitals:   10/25/13 2111 10/26/13 0101 10/26/13 0549 10/26/13 1000  BP: 171/79 121/73 121/64 133/83  Pulse: 106 84 80 79  Temp: 98.4 F (36.9 C) 98.4 F (36.9 C) 98.1 F (36.7 C) 98.2 F (36.8 C)  TempSrc: Oral Oral Oral Oral  Resp: 18 18 18 18   Height:      Weight:      SpO2: 95% 96% 100% 98%   Gen: up walking the halls, no distress CV: RRR Pulm: CTAB Abd: obese, appropriately tender, ND,  Inc: well approximated with dermabone LE: no edema, NT GU: def  Consults: None  Treatments: surgery: TLH/ BSO  Disposition: Final discharge disposition not confirmed   Future Appointments Provider Department Dept Phone   11/09/2013 1:45 PM [facility] [phone]   Please wear two piece clothing and wear no powder or deodorant. Please arrive 15 minutes early prior to your appointment time.       Medication List         acetaminophen 500 MG tablet  Commonly known as:  TYLENOL  Take 1,000 mg by mouth daily as needed for mild pain.     dicyclomine 20 MG tablet  Commonly known as:  BENTYL  Take 20 mg by mouth 2 (two) times daily.     hydrochlorothiazide 25 MG tablet  Commonly known as:  HYDRODIURIL  Take 12.5 mg by mouth daily.     omeprazole 20 MG capsule  Commonly known as:  PRILOSEC  Take 20 mg by mouth daily.     oxybutynin 5  MG tablet  Commonly known as:  DITROPAN  Take 5 mg by mouth 2 (two) times daily.     oxyCODONE-acetaminophen 10-325 MG per tablet  Commonly known as:  PERCOCET  Take 1 tablet by mouth every 4 (four) hours as needed for pain.     traMADol 50 MG tablet  Commonly known as:  ULTRAM  Take 100 mg by mouth 3 (three) times daily as needed for moderate pain (fibromyalgia).         Signed: Lendon ColonelFOGLEMAN,Kassey Laforest A., MD 10/26/2013, 10:53 AM

## 2013-10-27 MED FILL — Heparin Sodium (Porcine) Inj 5000 Unit/ML: INTRAMUSCULAR | Qty: 1 | Status: AC

## 2013-11-09 ENCOUNTER — Ambulatory Visit (HOSPITAL_COMMUNITY)
Admission: RE | Admit: 2013-11-09 | Discharge: 2013-11-09 | Disposition: A | Discharge status: Home / Self Care | Payer: 59 | Source: Ambulatory Visit | Attending: Obstetrics | Admitting: Obstetrics

## 2013-11-09 DIAGNOSIS — Z1231 Encounter for screening mammogram for malignant neoplasm of breast: Secondary | ICD-10-CM | POA: Insufficient documentation

## 2014-04-04 ENCOUNTER — Ambulatory Visit: Payer: 59 | Attending: Physical Medicine and Rehabilitation | Admitting: Physical Therapy

## 2014-04-04 DIAGNOSIS — R5381 Other malaise: Secondary | ICD-10-CM | POA: Diagnosis not present

## 2014-04-04 DIAGNOSIS — M545 Low back pain, unspecified: Secondary | ICD-10-CM | POA: Diagnosis not present

## 2014-04-04 DIAGNOSIS — I10 Essential (primary) hypertension: Secondary | ICD-10-CM | POA: Diagnosis not present

## 2014-04-04 DIAGNOSIS — IMO0001 Reserved for inherently not codable concepts without codable children: Secondary | ICD-10-CM | POA: Insufficient documentation

## 2014-04-09 ENCOUNTER — Encounter: Payer: 59 | Admitting: Physical Therapy

## 2014-04-11 ENCOUNTER — Ambulatory Visit: Payer: 59 | Admitting: Physical Therapy

## 2014-04-11 DIAGNOSIS — IMO0001 Reserved for inherently not codable concepts without codable children: Secondary | ICD-10-CM | POA: Diagnosis not present

## 2015-03-01 ENCOUNTER — Other Ambulatory Visit (HOSPITAL_COMMUNITY): Payer: Self-pay | Admitting: Physician Assistant

## 2015-03-01 DIAGNOSIS — Z1231 Encounter for screening mammogram for malignant neoplasm of breast: Secondary | ICD-10-CM

## 2015-03-11 ENCOUNTER — Encounter (HOSPITAL_BASED_OUTPATIENT_CLINIC_OR_DEPARTMENT_OTHER): Payer: Self-pay | Admitting: Emergency Medicine

## 2015-03-11 ENCOUNTER — Emergency Department (HOSPITAL_BASED_OUTPATIENT_CLINIC_OR_DEPARTMENT_OTHER)
Admission: EM | Admit: 2015-03-11 | Discharge: 2015-03-11 | Disposition: A | Discharge status: Home / Self Care | Payer: BLUE CROSS/BLUE SHIELD | Attending: Emergency Medicine | Admitting: Emergency Medicine

## 2015-03-11 DIAGNOSIS — Z8742 Personal history of other diseases of the female genital tract: Secondary | ICD-10-CM | POA: Diagnosis not present

## 2015-03-11 DIAGNOSIS — R42 Dizziness and giddiness: Secondary | ICD-10-CM

## 2015-03-11 DIAGNOSIS — Z872 Personal history of diseases of the skin and subcutaneous tissue: Secondary | ICD-10-CM | POA: Diagnosis not present

## 2015-03-11 DIAGNOSIS — Z79899 Other long term (current) drug therapy: Secondary | ICD-10-CM | POA: Diagnosis not present

## 2015-03-11 DIAGNOSIS — Z8739 Personal history of other diseases of the musculoskeletal system and connective tissue: Secondary | ICD-10-CM | POA: Insufficient documentation

## 2015-03-11 DIAGNOSIS — I10 Essential (primary) hypertension: Secondary | ICD-10-CM | POA: Insufficient documentation

## 2015-03-11 DIAGNOSIS — Z8679 Personal history of other diseases of the circulatory system: Secondary | ICD-10-CM

## 2015-03-11 DIAGNOSIS — K219 Gastro-esophageal reflux disease without esophagitis: Secondary | ICD-10-CM | POA: Diagnosis not present

## 2015-03-11 NOTE — ED Notes (Signed)
Pt in c/o hypertension, states she's been taking her BP and systolic has been slightly over 100 which is not normal for her. States family hx of stroke and wants to be evaluated for peace of mind. Pt BP in triage is 94 systolic.

## 2015-03-11 NOTE — ED Notes (Signed)
FAST assessment performed: Negative

## 2015-03-11 NOTE — ED Provider Notes (Signed)
CSN: [id number]     Arrival date & time 03/11/15  1146 History   First MD Initiated Contact with Patient 03/11/15 1210     Chief Complaint  Patient presents with  . Hypertension     (Consider location/radiation/quality/duration/timing/severity/associated sxs/prior Treatment) HPI Pt is a 54yo female with hx of HTN, GERD, fibromyalgia, palpitations, IBS, and overactive bladder, presenting to ED with concern for elevated BP. Pt reports having her annual physical with her PCP about 3 months ago and all blood work and exam were normal, pt to f/u in 6 months with Dr. Olena LeatherwoodHasanaj.  Until then, pt has been going to a clinic to have her Right thumb treated, while there, they have been getting BP readings of 130s/100s typically 136/102. Pt reports recording her BP this morning at home after finding her BP cuff and having a sensation of lightheadedness when she woke up.  Pt reports her diastolic BP has been over 100 in the low 100s.  Pt has family hx of strokes and would like to be evaluated in ED for peace of mind.  Pt denies symptoms at this time in ED. Denies HA, lightheadedness, change in vision, chest pain, SOB, palpitations, numbness or tingling, weakness, or change in speech.    Past Medical History  Diagnosis Date  . Hypertension   . GERD (gastroesophageal reflux disease)   . Fibromyalgia   . Dysrhythmia     Palpitations  . Cellulitis     Left leg  . IBS (irritable bowel syndrome)   . Overactive bladder    Past Surgical History  Procedure Laterality Date  . Dilation and curettage of uterus    . Fracture surgery      right hand  . Novasure ablation    . Polypectomy      uterine  . Vein surgery      Left leg  . Robotic assisted total hysterectomy with bilateral salpingo oopherectomy Bilateral 10/25/2013    Procedure: ROBOTIC ASSISTED TOTAL HYSTERECTOMY WITH BILATERAL SALPINGO OOPHORECTOMY;  Surgeon: Alphonsus SiasKelly A. Ernestina PennaFogleman, MD;  Location: [facility];  Service: Gynecology;  Laterality: Bilateral;  3  1/2 hrs.  . Laparoscopic assisted vaginal hysterectomy N/A 10/25/2013    Procedure: LAPAROSCOPIC ASSISTED VAGINAL HYSTERECTOMY; BSO;  Surgeon: Alphonsus SiasKelly A. Ernestina PennaFogleman, MD;  Location: [facility];  Service: Gynecology;  Laterality: N/A;   History reviewed. No pertinent family history. History  Substance Use Topics  . Smoking status: Never Smoker   . Smokeless tobacco: Not on file  . Alcohol Use: No   OB History    No data available     Review of Systems  Constitutional: Negative for fever and chills.  Eyes: Negative for photophobia, pain and visual disturbance.  Respiratory: Negative for cough and shortness of breath.   Cardiovascular: Negative for chest pain, palpitations and leg swelling.  Gastrointestinal: Negative for nausea and vomiting.  Musculoskeletal: Negative for myalgias, back pain and arthralgias.  Neurological: Positive for light-headedness. Negative for dizziness, tremors, seizures, syncope, speech difficulty, weakness, numbness and headaches.  All other systems reviewed and are negative.     Allergies  Rocephin  Home Medications   Prior to Admission medications   Medication Sig Start Date End Date Taking? Authorizing Provider  acetaminophen (TYLENOL) 500 MG tablet Take 1,000 mg by mouth daily as needed for mild pain.    Historical Provider, MD  dicyclomine (BENTYL) 20 MG tablet Take 20 mg by mouth 2 (two) times daily.    Historical Provider, MD  hydrochlorothiazide (HYDRODIURIL) 25 MG  tablet Take 12.5 mg by mouth daily.    Historical Provider, MD  omeprazole (PRILOSEC) 20 MG capsule Take 20 mg by mouth daily.    Historical Provider, MD  oxybutynin (DITROPAN) 5 MG tablet Take 5 mg by mouth 2 (two) times daily.    Historical Provider, MD  oxyCODONE-acetaminophen (PERCOCET) 10-325 MG per tablet Take 1 tablet by mouth every 4 (four) hours as needed for pain. 10/26/13   Noland FordyceKelly Fogleman, MD  traMADol (ULTRAM) 50 MG tablet Take 100 mg by mouth 3 (three) times daily as needed for  moderate pain (fibromyalgia).    Historical Provider, MD   BP 138/86 mmHg  Pulse 104  Temp(Src) 98.2 F (36.8 C) (Oral)  Resp 24  Ht 5\' 3"  (1.6 m)  Wt 210 lb (95.255 kg)  BMI 37.21 kg/m2  SpO2 99% Physical Exam  Constitutional: She is oriented to person, place, and time. She appears well-developed and well-nourished. No distress.  Pt resting comfortably on exam bed, NAD  HENT:  Head: Normocephalic and atraumatic.  Eyes: Conjunctivae and EOM are normal. Pupils are equal, round, and reactive to light. No scleral icterus.  Neck: Normal range of motion. Neck supple.  Cardiovascular: Normal rate, regular rhythm and normal heart sounds.   Pulmonary/Chest: Effort normal and breath sounds normal. No respiratory distress. She has no wheezes. She has no rales. She exhibits no tenderness.  Abdominal: Soft. Bowel sounds are normal. She exhibits no distension and no mass. There is no tenderness. There is no rebound and no guarding.  Musculoskeletal: Normal range of motion.  Neurological: She is alert and oriented to person, place, and time. She has normal strength. No cranial nerve deficit or sensory deficit. Coordination and gait normal. GCS eye subscore is 4. GCS verbal subscore is 5. GCS motor subscore is 6.  Skin: Skin is warm and dry. She is not diaphoretic.  Nursing note and vitals reviewed.   ED Course  Procedures (including critical care time) Labs Review Labs Reviewed - No data to display  Imaging Review No results found.   EKG Interpretation None      MDM   Final diagnoses:  History of hypertension  Lightheadedness   Pt is a 54yo female with hx of HTN, presenting to ED with concern for elevated BP despite taking her medication as prescribed. Reports lightheadedness earlier but resolved in ED. BP 142.91, gradually lowered while in ED to 138/86.  Pt asymptomatic in ED. No focal neuro deficit. Pt concerned for stroke due to family hx.  Advised pt labs could be drawn to check  her glucose and kidney function. Pt declined due to normal labs 3 months ago with PCP. No CT or MRI indicated at this time. Reassured pt. Encouraged pt to keep a detailed log of her reported elevated BP and f/u with PCP in 1 week to discuss BP concerns. Return precautions provided. Pt verbalized understanding and agreement with tx plan.    Junius Finnerrin O'Malley, PA-C 03/12/15 [id number]  Gerhard Munchobert Lockwood, MD 03/12/15 [phone]

## 2015-03-11 NOTE — ED Notes (Signed)
Presents to ED stating that her blood pressure has been assessed lately, that her BP is elevated and is on HCTZ, States has had dizziness.

## 2015-03-18 ENCOUNTER — Ambulatory Visit (HOSPITAL_COMMUNITY)
Admission: RE | Admit: 2015-03-18 | Discharge: 2015-03-18 | Disposition: A | Discharge status: Home / Self Care | Payer: BLUE CROSS/BLUE SHIELD | Source: Ambulatory Visit | Attending: Physician Assistant | Admitting: Physician Assistant

## 2015-03-18 ENCOUNTER — Other Ambulatory Visit (HOSPITAL_COMMUNITY): Payer: Self-pay | Admitting: Physician Assistant

## 2015-03-18 DIAGNOSIS — Z780 Asymptomatic menopausal state: Secondary | ICD-10-CM | POA: Insufficient documentation

## 2015-03-18 DIAGNOSIS — Z13820 Encounter for screening for osteoporosis: Secondary | ICD-10-CM

## 2015-03-18 DIAGNOSIS — Z1231 Encounter for screening mammogram for malignant neoplasm of breast: Secondary | ICD-10-CM | POA: Insufficient documentation

## 2015-12-05 ENCOUNTER — Ambulatory Visit (HOSPITAL_BASED_OUTPATIENT_CLINIC_OR_DEPARTMENT_OTHER): Payer: BLUE CROSS/BLUE SHIELD

## 2018-11-18 ENCOUNTER — Other Ambulatory Visit (HOSPITAL_COMMUNITY): Payer: Self-pay | Admitting: Family Medicine

## 2018-11-18 DIAGNOSIS — E2839 Other primary ovarian failure: Secondary | ICD-10-CM

## 2018-11-21 ENCOUNTER — Other Ambulatory Visit (HOSPITAL_COMMUNITY): Payer: Self-pay | Admitting: Family Medicine

## 2018-11-23 ENCOUNTER — Other Ambulatory Visit (HOSPITAL_COMMUNITY): Payer: Self-pay | Admitting: Nurse Practitioner

## 2018-11-23 DIAGNOSIS — N644 Mastodynia: Secondary | ICD-10-CM

## 2018-11-28 ENCOUNTER — Ambulatory Visit (HOSPITAL_COMMUNITY)
Admission: RE | Admit: 2018-11-28 | Discharge: 2018-11-28 | Disposition: A | Discharge status: Home / Self Care | Payer: BLUE CROSS/BLUE SHIELD | Source: Ambulatory Visit | Attending: Family Medicine | Admitting: Family Medicine

## 2018-11-28 DIAGNOSIS — E2839 Other primary ovarian failure: Secondary | ICD-10-CM | POA: Insufficient documentation

## 2018-12-20 ENCOUNTER — Ambulatory Visit (HOSPITAL_COMMUNITY): Admission: RE | Admit: 2018-12-20 | Payer: BLUE CROSS/BLUE SHIELD | Source: Ambulatory Visit

## 2018-12-20 ENCOUNTER — Ambulatory Visit (HOSPITAL_COMMUNITY)
Admission: RE | Admit: 2018-12-20 | Discharge: 2018-12-20 | Disposition: A | Discharge status: Home / Self Care | Payer: BLUE CROSS/BLUE SHIELD | Source: Ambulatory Visit | Attending: Nurse Practitioner | Admitting: Nurse Practitioner

## 2018-12-20 DIAGNOSIS — N644 Mastodynia: Secondary | ICD-10-CM | POA: Diagnosis present

## 2022-08-13 LAB — EXTERNAL GENERIC LAB PROCEDURE: COLOGUARD: NEGATIVE

## 2023-08-19 ENCOUNTER — Encounter (HOSPITAL_BASED_OUTPATIENT_CLINIC_OR_DEPARTMENT_OTHER): Payer: Self-pay | Admitting: Otolaryngology

## 2023-08-19 ENCOUNTER — Other Ambulatory Visit: Payer: Self-pay

## 2023-08-19 NOTE — Progress Notes (Signed)
   08/19/23 0857  Pre-op Phone Call  Surgery Date Verified 08/24/23  Arrival Time Verified 1200  Surgery Location Verified [facility] [place]  Medical History Reviewed Yes  Is the patient taking a GLP-1 receptor agonist? No  Does the patient have diabetes? No diagnosis of diabetes  Do you have a history of heart problems? Yes  Cardiologist Name Dr. Houston Siren  Have you ever had tests on your heart? Yes (loop recorder placed on 08/18/23)  What cardiac tests were performed? EKG  Results viewable: [facility] [employer] Tab  Does patient have other implanted devices? Yes (loop recorder)  Implanted Devices: Other (comment)  Does the patient have the ability to turn device off? No  Patient Teaching Enhanced Recovery;Pre / Post Procedure  Patient educated about smoking cessation 24 hours prior to surgery. N/A Non-Smoker  Patient verbalizes understanding of bowel prep? N/A  THA/TKA patients only:  By your surgery date, will you have been taking narcotics for 90 days or greater? Yes  Med Rec Completed Yes  Take the Following Meds the Morning of Surgery continue normal meds  Recent  Lab Work, EKG, CXR? No  NPO (Including gum & candy) After midnight  Allowed clear liquids Water;Gatorade  (diabetics please choose diet or no sugar options);Black Coffee Only (no creamer, milk or cream including half and half);Carbonated beverages - (diabetics please choose diet or no sugar options)  Patient instructed to stop clear liquids including Carb loading drink at: 0900  Stop Solids, Milk, Candy, and Gum STARTING AT MIDNIGHT  Did patient view EMMI videos? No  Responsible adult to drive and be with you for 24 hours? Yes  Name & Phone Number for Ride/Caregiver Husband  No Jewelry, money, nail polish or make-up.  No lotions, powders, perfumes. No shaving  48 hrs. prior to surgery. Yes  Contacts, Dentures & Glasses Will Have to be Removed Before OR. Yes  Please bring your ID and Insurance Card the morning of your surgery. (Surgery  Centers Only) Yes  Bring any papers or x-rays with you that your surgeon gave you. Yes  Instructed to contact the location of procedure/ provider if they or anyone in their household develops symptoms or tests positive for COVID-19, has close contact with someone who tests positive for COVID, or has known exposure to any contagious illness. Yes  Call this number the morning of surgery  with any problems that may cancel your surgery. [phone]  Covid-19 Assessment  Have you had a positive COVID-19 test within the previous 90 days? No  COVID Testing Guidance Proceed with the additional questions.  Patient's surgery required a COVID-19 test (cardiothoracic, complex ENT, and bronchoscopies/ EBUS) No  Have you been unmasked and in close contact with anyone with COVID-19 or COVID-19 symptoms within the past 10 days? No  Do you or anyone in your household currently have any COVID-19 symptoms? No   Reviewed with Dr. Okey Dupre okay for outpatient surgery

## 2023-08-20 ENCOUNTER — Other Ambulatory Visit: Payer: Self-pay | Admitting: Otolaryngology

## 2023-08-24 ENCOUNTER — Ambulatory Visit (HOSPITAL_BASED_OUTPATIENT_CLINIC_OR_DEPARTMENT_OTHER): Payer: Medicare Other | Admitting: Anesthesiology

## 2023-08-24 ENCOUNTER — Other Ambulatory Visit: Payer: Self-pay

## 2023-08-24 ENCOUNTER — Ambulatory Visit (HOSPITAL_BASED_OUTPATIENT_CLINIC_OR_DEPARTMENT_OTHER)
Admission: RE | Admit: 2023-08-24 | Discharge: 2023-08-24 | Disposition: A | Discharge status: Home / Self Care | Payer: Medicare Other | Attending: Otolaryngology | Admitting: Otolaryngology

## 2023-08-24 ENCOUNTER — Encounter (HOSPITAL_BASED_OUTPATIENT_CLINIC_OR_DEPARTMENT_OTHER): Payer: Self-pay | Admitting: Otolaryngology

## 2023-08-24 ENCOUNTER — Encounter (HOSPITAL_BASED_OUTPATIENT_CLINIC_OR_DEPARTMENT_OTHER)
Admission: RE | Disposition: A | Discharge status: Home / Self Care | Payer: Self-pay | Source: Home / Self Care | Attending: Otolaryngology

## 2023-08-24 DIAGNOSIS — I10 Essential (primary) hypertension: Secondary | ICD-10-CM | POA: Diagnosis not present

## 2023-08-24 DIAGNOSIS — G4733 Obstructive sleep apnea (adult) (pediatric): Secondary | ICD-10-CM | POA: Insufficient documentation

## 2023-08-24 DIAGNOSIS — K219 Gastro-esophageal reflux disease without esophagitis: Secondary | ICD-10-CM | POA: Insufficient documentation

## 2023-08-24 DIAGNOSIS — M797 Fibromyalgia: Secondary | ICD-10-CM | POA: Diagnosis not present

## 2023-08-24 HISTORY — DX: Sleep apnea, unspecified: G47.30

## 2023-08-24 HISTORY — PX: DRUG INDUCED ENDOSCOPY: SHX6808

## 2023-08-24 SURGERY — DRUG INDUCED SLEEP ENDOSCOPY
Anesthesia: Monitor Anesthesia Care | Site: Nose

## 2023-08-24 MED ORDER — LACTATED RINGERS IV SOLN: INTRAVENOUS | Status: DC

## 2023-08-24 MED ORDER — FENTANYL CITRATE (PF) 100 MCG/2ML IJ SOLN: 25.0000 ug | INTRAMUSCULAR | Status: DC | PRN

## 2023-08-24 MED ORDER — OXYMETAZOLINE HCL 0.05 % NA SOLN
NASAL | Status: DC | PRN
  Administered 2023-08-24: 1 via TOPICAL

## 2023-08-24 MED ORDER — PROPOFOL 10 MG/ML IV BOLUS
INTRAVENOUS | Status: DC | PRN
  Administered 2023-08-24: 20 mg via INTRAVENOUS

## 2023-08-24 MED ORDER — ONDANSETRON HCL 4 MG/2ML IJ SOLN: 4.0000 mg | Freq: Once | INTRAMUSCULAR | Status: DC | PRN

## 2023-08-24 MED ORDER — OXYMETAZOLINE HCL 0.05 % NA SOLN
NASAL | Status: AC
  Filled 2023-08-24: qty 30

## 2023-08-24 MED ORDER — ACETAMINOPHEN 10 MG/ML IV SOLN
1000.0000 mg | Freq: Once | INTRAVENOUS | Status: DC | PRN
Start: 2023-08-24 — End: 2023-08-24

## 2023-08-24 MED ORDER — PROPOFOL 500 MG/50ML IV EMUL
INTRAVENOUS | Status: DC | PRN
  Administered 2023-08-24: 100 ug/kg/min via INTRAVENOUS

## 2023-08-24 MED ORDER — LIDOCAINE HCL (CARDIAC) PF 100 MG/5ML IV SOSY
PREFILLED_SYRINGE | INTRAVENOUS | Status: DC | PRN
  Administered 2023-08-24: 50 mg via INTRAVENOUS

## 2023-08-24 MED ORDER — AMISULPRIDE (ANTIEMETIC) 5 MG/2ML IV SOLN: 10.0000 mg | Freq: Once | INTRAVENOUS | Status: DC | PRN

## 2023-08-24 MED ORDER — ONDANSETRON HCL 4 MG/2ML IJ SOLN
INTRAMUSCULAR | Status: DC | PRN
  Administered 2023-08-24: 4 mg via INTRAVENOUS

## 2023-08-24 SURGICAL SUPPLY — 13 items
ANTIFOG SOL W/FOAM PAD STRL (MISCELLANEOUS) ×1
CANISTER SUCT 1200ML W/VALVE (MISCELLANEOUS) ×2 IMPLANT
GLOVE BIO SURGEON STRL SZ7.5 (GLOVE) ×2 IMPLANT
GLOVE BIOGEL PI IND STRL 7.5 (GLOVE) IMPLANT
KIT CLEAN ENDO (MISCELLANEOUS) ×2 IMPLANT
NDL HYPO 27GX1-1/4 (NEEDLE) IMPLANT
NEEDLE HYPO 27GX1-1/4 (NEEDLE)
PATTIES SURGICAL .5 X3 (DISPOSABLE) ×2 IMPLANT
SHEET MEDIUM DRAPE 40X70 STRL (DRAPES) ×2 IMPLANT
SOLUTION ANTFG W/FOAM PAD STRL (MISCELLANEOUS) ×2 IMPLANT
SYR CONTROL 10ML LL (SYRINGE) IMPLANT
TOWEL GREEN STERILE FF (TOWEL DISPOSABLE) ×2 IMPLANT
TUBE CONNECTING 20X1/4 (TUBING) IMPLANT

## 2023-08-24 NOTE — Anesthesia Postprocedure Evaluation (Signed)
Anesthesia Post Note  Patient: Lisa Braun  Procedure(s) Performed: DRUG INDUCED SLEEP ENDOSCOPY (Nose)     Patient location during evaluation: PACU Anesthesia Type: MAC Level of consciousness: awake Pain management: pain level controlled Vital Signs Assessment: post-procedure vital signs reviewed and stable Respiratory status: spontaneous breathing, nonlabored ventilation and respiratory function stable Cardiovascular status: blood pressure returned to baseline and stable Postop Assessment: no apparent nausea or vomiting Anesthetic complications: no   No notable events documented.  Last Vitals:  Vitals:   08/24/23 1423 08/24/23 1432  BP:  117/78  Pulse: 64 69  Resp: 18 18  Temp:  36.6 C  SpO2: 93% 95%    Last Pain:  Vitals:   08/24/23 1432  TempSrc:   PainSc: 0-No pain                 Trevontae Lindahl P Trudy Kory

## 2023-08-24 NOTE — Anesthesia Preprocedure Evaluation (Addendum)
Anesthesia Evaluation  Patient identified by MRN, date of birth, ID band Patient awake    Reviewed: Allergy & Precautions, NPO status , Patient's Chart, lab work & pertinent test results  Airway Mallampati: II  TM Distance: >3 FB Neck ROM: Full    Dental  (+) Poor Dentition, Missing   Pulmonary sleep apnea    Pulmonary exam normal        Cardiovascular hypertension, Pt. on home beta blockers Normal cardiovascular exam     Neuro/Psych  Neuromuscular disease  negative psych ROS   GI/Hepatic Neg liver ROS,GERD  Medicated and Controlled,,  Endo/Other  negative endocrine ROS    Renal/GU negative Renal ROS     Musculoskeletal  (+)  Fibromyalgia -, narcotic dependent  Abdominal  (+) + obese  Peds  Hematology negative hematology ROS (+)   Anesthesia Other Findings Obstructive sleep apnea  Reproductive/Obstetrics                             Anesthesia Physical Anesthesia Plan  ASA: 3  Anesthesia Plan: MAC   Post-op Pain Management:    Induction: Intravenous  PONV Risk Score and Plan: 2 and Propofol infusion and Treatment may vary due to age or medical condition  Airway Management Planned: Natural Airway  Additional Equipment:   Intra-op Plan:   Post-operative Plan:   Informed Consent: I have reviewed the patients History and Physical, chart, labs and discussed the procedure including the risks, benefits and alternatives for the proposed anesthesia with the patient or authorized representative who has indicated his/her understanding and acceptance.     Dental advisory given  Plan Discussed with: CRNA  Anesthesia Plan Comments:        Anesthesia Quick Evaluation

## 2023-08-24 NOTE — H&P (Signed)
Lisa Braun is an 62 y.o. female.   Chief Complaint: Sleep apnea HPI: 62 year old female with sleep apnea who has been unable to tolerate CPAP.  Past Medical History:  Diagnosis Date   Cellulitis    Left leg   Dysrhythmia    Palpitations   Fibromyalgia    GERD (gastroesophageal reflux disease)    Hypertension    IBS (irritable bowel syndrome)    Overactive bladder    Sleep apnea     Past Surgical History:  Procedure Laterality Date   DILATION AND CURETTAGE OF UTERUS     FRACTURE SURGERY     right hand   LAPAROSCOPIC ASSISTED VAGINAL HYSTERECTOMY N/A 10/25/2013   Procedure: LAPAROSCOPIC ASSISTED VAGINAL HYSTERECTOMY; BSO;  Surgeon: Alphonsus Sias. Ernestina Penna, MD;  Location: [facility];  Service: Gynecology;  Laterality: N/A;   NOVASURE ABLATION     POLYPECTOMY     uterine   ROBOTIC ASSISTED TOTAL HYSTERECTOMY WITH BILATERAL SALPINGO OOPHERECTOMY Bilateral 10/25/2013   Procedure: ROBOTIC ASSISTED TOTAL HYSTERECTOMY WITH BILATERAL SALPINGO OOPHORECTOMY;  Surgeon: Alphonsus Sias. Ernestina Penna, MD;  Location: [facility];  Service: Gynecology;  Laterality: Bilateral;  3 1/2 hrs.   VEIN SURGERY     Left leg    History reviewed. No pertinent family history. Social History:  reports that she has never smoked. She does not have any smokeless tobacco history on file. She reports that she does not drink alcohol and does not use drugs.  Allergies:  Allergies  Allergen Reactions   Isosorbide    Rocephin [Ceftriaxone Sodium In Dextrose] Hives    She thinks it is Rocephin    Medications Prior to Admission  Medication Sig Dispense Refill   acetaminophen (TYLENOL) 500 MG tablet Take 1,000 mg by mouth daily as needed for mild pain.     buPROPion (WELLBUTRIN XL) 150 MG 24 hr tablet Take 150 mg by mouth daily.     dicyclomine (BENTYL) 20 MG tablet Take 20 mg by mouth 2 (two) times daily.     metoprolol succinate (TOPROL-XL) 25 MG 24 hr tablet Take 25 mg by mouth daily.     omeprazole (PRILOSEC) 20 MG capsule  Take 20 mg by mouth daily.     oxybutynin (DITROPAN) 5 MG tablet Take 5 mg by mouth 2 (two) times daily.     ranolazine (RANEXA) 500 MG 12 hr tablet Take 500 mg by mouth 2 (two) times daily.     rosuvastatin (CRESTOR) 10 MG tablet Take 10 mg by mouth daily.     traMADol (ULTRAM) 50 MG tablet Take 100 mg by mouth 3 (three) times daily as needed for moderate pain (fibromyalgia).      No results found for this or any previous visit (from the past 48 hour(s)). No results found.  Review of Systems  All other systems reviewed and are negative.   Blood pressure (!) 147/90, pulse 77, temperature 98.1 F (36.7 C), temperature source Oral, resp. rate 16, height 5\' 3"  (1.6 m), weight 86.2 kg, SpO2 100%. Physical Exam Constitutional:      Appearance: Normal appearance. She is normal weight.  HENT:     Head: Normocephalic and atraumatic.     Right Ear: External ear normal.     Left Ear: External ear normal.     Nose: Nose normal.     Mouth/Throat:     Mouth: Mucous membranes are moist.     Pharynx: Oropharynx is clear.  Eyes:     Extraocular Movements: Extraocular  movements intact.     Conjunctiva/sclera: Conjunctivae normal.     Pupils: Pupils are equal, round, and reactive to light.  Cardiovascular:     Rate and Rhythm: Normal rate.  Pulmonary:     Effort: Pulmonary effort is normal.  Skin:    General: Skin is warm and dry.  Neurological:     General: No focal deficit present.     Mental Status: She is alert and oriented to person, place, and time.  Psychiatric:        Mood and Affect: Mood normal.        Behavior: Behavior normal.        Thought Content: Thought content normal.        Judgment: Judgment normal.      Assessment/Plan Obstructive sleep apnea and BMI 33.66.  To OR for sleep endoscopy.  Christia Reading, MD 08/24/2023, 1:12 PM

## 2023-08-24 NOTE — Transfer of Care (Signed)
Immediate Anesthesia Transfer of Care Note  Patient: Lisa Braun  Procedure(s) Performed: DRUG INDUCED SLEEP ENDOSCOPY (Nose)  Patient Location: PACU  Anesthesia Type:MAC  Level of Consciousness: awake, alert , and oriented  Airway & Oxygen Therapy: Patient Spontanous Breathing  Post-op Assessment: Report given to RN and Post -op Vital signs reviewed and stable  Post vital signs: Reviewed and stable  Last Vitals:  Vitals Value Taken Time  BP 109/72 08/24/23 1415  Temp    Pulse 62 08/24/23 1415  Resp 18 08/24/23 1415  SpO2 94 % 08/24/23 1415  Vitals shown include unfiled device data.  Last Pain:  Vitals:   08/24/23 1244  TempSrc: Oral  PainSc: 3       Patients Stated Pain Goal: 5 (08/24/23 1244)  Complications: No notable events documented.

## 2023-08-24 NOTE — Op Note (Signed)
Preop diagnosis: Obstructive sleep apnea Postop diagnosis: same Procedure: Drug-induced sleep endoscopy Surgeon: Jenne Pane Anesth: IV sedation Compl: None Findings: There is majority anterior-posterior collapse at the velum making her a candidate for hypoglossal nerve stimulator placement.  There was also marked anterior-posterior collapse at the tongue base. Description:  After discussing risks, benefits, and alternatives, the patient was brought to the operative suite and placed on the operative table in the supine position.  Anesthesia was induced and the patient was given light sedation to simulate natural sleep. When the proper level was reached, an Afrin-soaked pledget was placed in the right nasal passage for a couple of minutes and then removed.  The fiberoptic laryngoscope was then passed to view the pharynx and larynx.  Findings are noted above and the exam was recorded.  After completion, the scope was removed and the patient was returned to anesthesia for wakeup and was moved to the recovery room in stable condition.

## 2023-08-24 NOTE — Brief Op Note (Signed)
08/24/2023  2:10 PM  PATIENT:  Lisa Braun  62 y.o. female  PRE-OPERATIVE DIAGNOSIS:  Obstructive sleep apnea  POST-OPERATIVE DIAGNOSIS:  Obstructive sleep apnea  PROCEDURE:  Procedure(s): DRUG INDUCED SLEEP ENDOSCOPY (N/A)  SURGEON:  Surgeons and Role:    Christia Reading, MD - Primary  PHYSICIAN ASSISTANT:   ASSISTANTS: none   ANESTHESIA:   IV sedation  EBL:  None   BLOOD ADMINISTERED:none  DRAINS: none   LOCAL MEDICATIONS USED:  NONE  SPECIMEN:  No Specimen  DISPOSITION OF SPECIMEN:  N/A  COUNTS:  YES  TOURNIQUET:  * No tourniquets in log *  DICTATION: .Note written in EPIC  PLAN OF CARE: Discharge to home after PACU  PATIENT DISPOSITION:  PACU - hemodynamically stable.   Delay start of Pharmacological VTE agent (>24hrs) due to surgical blood loss or risk of bleeding: no

## 2023-08-24 NOTE — Discharge Instructions (Signed)

## 2023-08-25 ENCOUNTER — Encounter (HOSPITAL_BASED_OUTPATIENT_CLINIC_OR_DEPARTMENT_OTHER): Payer: Self-pay | Admitting: Otolaryngology

## 2023-10-08 ENCOUNTER — Other Ambulatory Visit: Payer: Self-pay | Admitting: Otolaryngology

## 2023-11-09 ENCOUNTER — Encounter (HOSPITAL_BASED_OUTPATIENT_CLINIC_OR_DEPARTMENT_OTHER): Payer: Self-pay | Admitting: Otolaryngology

## 2023-11-09 ENCOUNTER — Other Ambulatory Visit: Payer: Self-pay

## 2023-11-09 NOTE — Progress Notes (Signed)
 Called and spoke with Albin Felling at Dr. Jenne Pane office, patient will need a cardiac clearance prior to surgery at [facility].

## 2023-11-15 NOTE — Progress Notes (Signed)
Message left for Lisa Braun at Dr. Jenne Pane office regarding cardiac clearance for surgery scheduled 11/17/23.

## 2023-11-16 NOTE — Progress Notes (Signed)
Talked with Angelique Blonder at Dr Jenne Pane office, they were not able to get cardiac clearance on this patient so they will reschedule her after her cards appt on 11-22-23. She will call patient and let her know.

## 2023-11-17 ENCOUNTER — Ambulatory Visit (HOSPITAL_BASED_OUTPATIENT_CLINIC_OR_DEPARTMENT_OTHER): Admission: RE | Admit: 2023-11-17 | Payer: Medicare Other | Source: Ambulatory Visit | Admitting: Otolaryngology

## 2023-11-17 DIAGNOSIS — Z01818 Encounter for other preprocedural examination: Secondary | ICD-10-CM

## 2023-11-17 SURGERY — IMPLANTATION OF HYPOGLOSSAL NERVE STIMULATOR
Anesthesia: General | Laterality: Right

## 2024-01-13 ENCOUNTER — Other Ambulatory Visit: Payer: Self-pay | Admitting: Otolaryngology

## 2024-01-18 ENCOUNTER — Encounter (HOSPITAL_BASED_OUTPATIENT_CLINIC_OR_DEPARTMENT_OTHER): Payer: Self-pay | Admitting: Otolaryngology

## 2024-01-18 ENCOUNTER — Other Ambulatory Visit: Payer: Self-pay

## 2024-01-25 ENCOUNTER — Encounter (HOSPITAL_BASED_OUTPATIENT_CLINIC_OR_DEPARTMENT_OTHER): Payer: Self-pay | Admitting: Otolaryngology

## 2024-01-25 ENCOUNTER — Ambulatory Visit (HOSPITAL_BASED_OUTPATIENT_CLINIC_OR_DEPARTMENT_OTHER): Admitting: Anesthesiology

## 2024-01-25 ENCOUNTER — Other Ambulatory Visit: Payer: Self-pay

## 2024-01-25 ENCOUNTER — Ambulatory Visit (HOSPITAL_COMMUNITY)

## 2024-01-25 ENCOUNTER — Encounter (HOSPITAL_BASED_OUTPATIENT_CLINIC_OR_DEPARTMENT_OTHER)
Admission: RE | Disposition: A | Discharge status: Home / Self Care | Payer: Self-pay | Source: Home / Self Care | Attending: Otolaryngology

## 2024-01-25 ENCOUNTER — Ambulatory Visit (HOSPITAL_BASED_OUTPATIENT_CLINIC_OR_DEPARTMENT_OTHER)
Admission: RE | Admit: 2024-01-25 | Discharge: 2024-01-25 | Disposition: A | Discharge status: Home / Self Care | Attending: Otolaryngology | Admitting: Otolaryngology

## 2024-01-25 DIAGNOSIS — Z79899 Other long term (current) drug therapy: Secondary | ICD-10-CM | POA: Diagnosis not present

## 2024-01-25 DIAGNOSIS — G4730 Sleep apnea, unspecified: Secondary | ICD-10-CM | POA: Diagnosis not present

## 2024-01-25 DIAGNOSIS — Z6835 Body mass index (BMI) 35.0-35.9, adult: Secondary | ICD-10-CM | POA: Diagnosis not present

## 2024-01-25 DIAGNOSIS — I10 Essential (primary) hypertension: Secondary | ICD-10-CM

## 2024-01-25 DIAGNOSIS — Z01818 Encounter for other preprocedural examination: Secondary | ICD-10-CM

## 2024-01-25 DIAGNOSIS — G4733 Obstructive sleep apnea (adult) (pediatric): Secondary | ICD-10-CM

## 2024-01-25 DIAGNOSIS — E66812 Obesity, class 2: Secondary | ICD-10-CM | POA: Insufficient documentation

## 2024-01-25 DIAGNOSIS — K219 Gastro-esophageal reflux disease without esophagitis: Secondary | ICD-10-CM | POA: Insufficient documentation

## 2024-01-25 DIAGNOSIS — M797 Fibromyalgia: Secondary | ICD-10-CM | POA: Insufficient documentation

## 2024-01-25 HISTORY — PX: IMPLANTATION OF HYPOGLOSSAL NERVE STIMULATOR: SHX6827

## 2024-01-25 SURGERY — INSERTION, HYPOGLOSSAL NERVE STIMULATOR
Anesthesia: General | Site: Neck | Laterality: Bilateral

## 2024-01-25 MED ORDER — ONDANSETRON HCL 4 MG/2ML IJ SOLN
INTRAMUSCULAR | Status: DC | PRN
  Administered 2024-01-25: 4 mg via INTRAVENOUS

## 2024-01-25 MED ORDER — ONDANSETRON HCL 4 MG/2ML IJ SOLN
INTRAMUSCULAR | Status: AC
  Filled 2024-01-25: qty 2

## 2024-01-25 MED ORDER — ACETAMINOPHEN 10 MG/ML IV SOLN
INTRAVENOUS | Status: DC | PRN
Start: 2024-01-25 — End: 2024-01-25
  Administered 2024-01-25: 1000 mg via INTRAVENOUS

## 2024-01-25 MED ORDER — PROPOFOL 10 MG/ML IV BOLUS
INTRAVENOUS | Status: DC | PRN
  Administered 2024-01-25: 150 mg via INTRAVENOUS

## 2024-01-25 MED ORDER — LACTATED RINGERS IV SOLN: INTRAVENOUS | Status: DC

## 2024-01-25 MED ORDER — VANCOMYCIN HCL IN DEXTROSE 1-5 GM/200ML-% IV SOLN
1000.0000 mg | INTRAVENOUS | Status: AC
Start: 2024-01-25 — End: 2024-01-25
  Administered 2024-01-25: 1000 mg via INTRAVENOUS

## 2024-01-25 MED ORDER — MIDAZOLAM HCL 5 MG/5ML IJ SOLN
INTRAMUSCULAR | Status: DC | PRN
  Administered 2024-01-25: 2 mg via INTRAVENOUS

## 2024-01-25 MED ORDER — DEXAMETHASONE SODIUM PHOSPHATE 10 MG/ML IJ SOLN
INTRAMUSCULAR | Status: DC | PRN
  Administered 2024-01-25: 10 mg via INTRAVENOUS

## 2024-01-25 MED ORDER — OXYCODONE HCL 5 MG/5ML PO SOLN: 5.0000 mg | Freq: Once | ORAL | Status: DC | PRN

## 2024-01-25 MED ORDER — ACETAMINOPHEN 500 MG PO TABS: 1000.0000 mg | ORAL_TABLET | Freq: Once | ORAL | Status: DC

## 2024-01-25 MED ORDER — DROPERIDOL 2.5 MG/ML IJ SOLN: 0.6250 mg | Freq: Once | INTRAMUSCULAR | Status: DC | PRN

## 2024-01-25 MED ORDER — EPHEDRINE SULFATE (PRESSORS) 50 MG/ML IJ SOLN
INTRAMUSCULAR | Status: DC | PRN
  Administered 2024-01-25: 10 mg via INTRAVENOUS

## 2024-01-25 MED ORDER — LIDOCAINE-EPINEPHRINE 1 %-1:100000 IJ SOLN
INTRAMUSCULAR | Status: DC | PRN
  Administered 2024-01-25: 5 mL

## 2024-01-25 MED ORDER — FENTANYL CITRATE (PF) 250 MCG/5ML IJ SOLN
INTRAMUSCULAR | Status: DC | PRN
Start: 2024-01-25 — End: 2024-01-25
  Administered 2024-01-25: 100 ug via INTRAVENOUS

## 2024-01-25 MED ORDER — LIDOCAINE-EPINEPHRINE 1 %-1:100000 IJ SOLN
INTRAMUSCULAR | Status: AC
  Filled 2024-01-25: qty 1

## 2024-01-25 MED ORDER — VANCOMYCIN HCL IN DEXTROSE 1-5 GM/200ML-% IV SOLN
INTRAVENOUS | Status: AC
  Filled 2024-01-25: qty 200

## 2024-01-25 MED ORDER — PROPOFOL 500 MG/50ML IV EMUL
INTRAVENOUS | Status: DC | PRN
  Administered 2024-01-25: 50 ug/kg/min via INTRAVENOUS

## 2024-01-25 MED ORDER — HYDROCODONE-ACETAMINOPHEN 5-325 MG PO TABS: 1.0000 | ORAL_TABLET | Freq: Four times a day (QID) | ORAL | 0 refills | Status: AC | PRN

## 2024-01-25 MED ORDER — SUCCINYLCHOLINE CHLORIDE 200 MG/10ML IV SOSY
PREFILLED_SYRINGE | INTRAVENOUS | Status: DC | PRN
  Administered 2024-01-25: 140 mg via INTRAVENOUS

## 2024-01-25 MED ORDER — LIDOCAINE 2% (20 MG/ML) 5 ML SYRINGE
INTRAMUSCULAR | Status: DC | PRN
Start: 2024-01-25 — End: 2024-01-25
  Administered 2024-01-25: 40 mg via INTRAVENOUS

## 2024-01-25 MED ORDER — DEXAMETHASONE SODIUM PHOSPHATE 10 MG/ML IJ SOLN
INTRAMUSCULAR | Status: AC
  Filled 2024-01-25: qty 1

## 2024-01-25 MED ORDER — ACETAMINOPHEN 10 MG/ML IV SOLN
INTRAVENOUS | Status: AC
Start: 2024-01-25 — End: ?
  Filled 2024-01-25: qty 100

## 2024-01-25 MED ORDER — 0.9 % SODIUM CHLORIDE (POUR BTL) OPTIME
TOPICAL | Status: DC | PRN
  Administered 2024-01-25: 1000 mL

## 2024-01-25 MED ORDER — PHENYLEPHRINE HCL-NACL 20-0.9 MG/250ML-% IV SOLN
INTRAVENOUS | Status: DC | PRN
  Administered 2024-01-25: 20 ug/min via INTRAVENOUS

## 2024-01-25 MED ORDER — OXYCODONE HCL 5 MG PO TABS: 5.0000 mg | ORAL_TABLET | Freq: Once | ORAL | Status: DC | PRN

## 2024-01-25 MED ORDER — FENTANYL CITRATE (PF) 100 MCG/2ML IJ SOLN
INTRAMUSCULAR | Status: AC
  Filled 2024-01-25: qty 2

## 2024-01-25 MED ORDER — FENTANYL CITRATE (PF) 100 MCG/2ML IJ SOLN: 25.0000 ug | INTRAMUSCULAR | Status: DC | PRN

## 2024-01-25 MED ORDER — MIDAZOLAM HCL 2 MG/2ML IJ SOLN
INTRAMUSCULAR | Status: AC
  Filled 2024-01-25: qty 2

## 2024-01-25 SURGICAL SUPPLY — 59 items
BLADE CLIPPER SURG (BLADE) IMPLANT
BLADE SURG 15 STRL LF DISP TIS (BLADE) ×2 IMPLANT
CANISTER SUCT 1200ML W/VALVE (MISCELLANEOUS) ×2 IMPLANT
CORD BIPOLAR FORCEPS 12FT (ELECTRODE) ×2 IMPLANT
COVER PROBE CYLINDRICAL 5X96 (MISCELLANEOUS) ×2 IMPLANT
DERMABOND ADVANCED .7 DNX12 (GAUZE/BANDAGES/DRESSINGS) ×4 IMPLANT
DRAPE C-ARM 35X43 STRL (DRAPES) ×2 IMPLANT
DRAPE HEAD BAR (DRAPES) IMPLANT
DRAPE INCISE IOBAN 66X45 STRL (DRAPES) ×2 IMPLANT
DRAPE MICROSCOPE WILD 40.5X102 (DRAPES) ×2 IMPLANT
DRAPE UTILITY XL STRL (DRAPES) ×2 IMPLANT
DRSG TEGADERM 2-3/8X2-3/4 SM (GAUZE/BANDAGES/DRESSINGS) ×4 IMPLANT
DRSG TEGADERM 4X4.75 (GAUZE/BANDAGES/DRESSINGS) IMPLANT
ELECT COATED BLADE 2.86 ST (ELECTRODE) ×2 IMPLANT
ELECT EMG 18 NIMS (NEUROSURGERY SUPPLIES) ×1 IMPLANT
ELECT REM PT RETURN 9FT ADLT (ELECTROSURGICAL) ×1 IMPLANT
ELECTRODE EMG 18 NIMS (NEUROSURGERY SUPPLIES) ×2 IMPLANT
ELECTRODE REM PT RTRN 9FT ADLT (ELECTROSURGICAL) ×2 IMPLANT
FORCEPS BIPOLAR SPETZLER 8 1.0 (NEUROSURGERY SUPPLIES) ×2 IMPLANT
GAUZE 4X4 16PLY ~~LOC~~+RFID DBL (SPONGE) ×2 IMPLANT
GAUZE SPONGE 4X4 12PLY STRL (GAUZE/BANDAGES/DRESSINGS) ×2 IMPLANT
GENERATOR PULSE INSPIRE (Generator) ×1 IMPLANT
GENERATOR PULSE INSPIRE IV (Generator) ×2 IMPLANT
GLOVE BIO SURGEON STRL SZ 6.5 (GLOVE) IMPLANT
GLOVE BIO SURGEON STRL SZ7.5 (GLOVE) ×2 IMPLANT
GOWN STRL REUS W/ TWL LRG LVL3 (GOWN DISPOSABLE) ×6 IMPLANT
IV CATH 18G SAFETY (IV SOLUTION) ×2 IMPLANT
KIT NEURO ACCESSORY W/WRENCH (MISCELLANEOUS) IMPLANT
LEAD SENSING RESP INSPIRE (Lead) ×1 IMPLANT
LEAD SENSING RESP INSPIRE IV (Lead) ×2 IMPLANT
LEAD SLEEP STIM INSPIRE IV/V (Lead) ×2 IMPLANT
LEAD SLEEP STIMULATION INSPIRE (Lead) ×1 IMPLANT
LOOP VASCLR MAXI BLUE 18IN ST (MISCELLANEOUS) ×2 IMPLANT
LOOP VESSEL MINI RED (MISCELLANEOUS) ×2 IMPLANT
MARKER SKIN DUAL TIP RULER LAB (MISCELLANEOUS) ×2 IMPLANT
NDL HYPO 25X1 1.5 SAFETY (NEEDLE) ×2 IMPLANT
NEEDLE HYPO 25X1 1.5 SAFETY (NEEDLE) ×1 IMPLANT
NS IRRIG 1000ML POUR BTL (IV SOLUTION) ×2 IMPLANT
PACK BASIN DAY SURGERY FS (CUSTOM PROCEDURE TRAY) ×2 IMPLANT
PACK ENT DAY SURGERY (CUSTOM PROCEDURE TRAY) ×2 IMPLANT
PASSER CATH 36 CODMAN DISP (NEUROSURGERY SUPPLIES) IMPLANT
PASSER CATH 38CM DISP (INSTRUMENTS) IMPLANT
PENCIL SMOKE EVACUATOR (MISCELLANEOUS) ×2 IMPLANT
PROBE NERVE STIMULATOR (NEUROSURGERY SUPPLIES) ×2 IMPLANT
REMOTE CONTROL SLEEP INSPIRE (MISCELLANEOUS) ×2 IMPLANT
SET WALTER ACTIVATION W/DRAPE (SET/KITS/TRAYS/PACK) ×2 IMPLANT
SLEEVE SCD COMPRESS KNEE MED (STOCKING) ×2 IMPLANT
SPONGE INTESTINAL PEANUT (DISPOSABLE) ×2 IMPLANT
SUT SILK 2 0 SH (SUTURE) ×2 IMPLANT
SUT SILK 3 0 REEL (SUTURE) ×2 IMPLANT
SUT SILK 3 0 SH 30 (SUTURE) ×2 IMPLANT
SUT SILK 3-0 RB1 30XBRD (SUTURE) ×1 IMPLANT
SUT VIC AB 3-0 SH 27X BRD (SUTURE) ×4 IMPLANT
SUT VIC AB 4-0 PS2 27 (SUTURE) ×4 IMPLANT
SUTURE SILK 3-0 RB1 30XBRD (SUTURE) ×2 IMPLANT
SYR 10ML LL (SYRINGE) ×2 IMPLANT
SYR BULB EAR ULCER 3OZ GRN STR (SYRINGE) ×2 IMPLANT
TIE VASCULAR MAXI BLUE 18IN ST (MISCELLANEOUS) ×1 IMPLANT
TOWEL GREEN STERILE FF (TOWEL DISPOSABLE) ×4 IMPLANT

## 2024-01-25 NOTE — Anesthesia Preprocedure Evaluation (Addendum)
 Anesthesia Evaluation  Patient identified by MRN, date of birth, ID band Patient awake    Reviewed: Allergy & Precautions, NPO status , Patient's Chart, lab work & pertinent test results  Airway Mallampati: II  TM Distance: >3 FB Neck ROM: Full    Dental  (+) Poor Dentition, Loose, Dental Advisory Given   Pulmonary sleep apnea    Pulmonary exam normal breath sounds clear to auscultation       Cardiovascular hypertension, Pt. on home beta blockers Normal cardiovascular exam+ dysrhythmias  Rhythm:Regular Rate:Normal     Neuro/Psych    GI/Hepatic Neg liver ROS,GERD  Medicated and Controlled,,  Endo/Other  negative endocrine ROS    Renal/GU negative Renal ROS     Musculoskeletal  (+)  Fibromyalgia -  Abdominal  (+) + obese  Peds  Hematology negative hematology ROS (+)   Anesthesia Other Findings   Reproductive/Obstetrics                              Anesthesia Physical Anesthesia Plan  ASA: 3  Anesthesia Plan: General   Post-op Pain Management: Tylenol PO (pre-op)*   Induction: Intravenous  PONV Risk Score and Plan: 4 or greater and Ondansetron, Dexamethasone, Treatment may vary due to age or medical condition and Midazolam  Airway Management Planned: Oral ETT  Additional Equipment:   Intra-op Plan:   Post-operative Plan: Extubation in OR  Informed Consent: I have reviewed the patients History and Physical, chart, labs and discussed the procedure including the risks, benefits and alternatives for the proposed anesthesia with the patient or authorized representative who has indicated his/her understanding and acceptance.     Dental advisory given  Plan Discussed with: CRNA  Anesthesia Plan Comments:         Anesthesia Quick Evaluation

## 2024-01-25 NOTE — H&P (Signed)
 Lisa Braun is an 63 y.o. female.   Chief Complaint: Sleep apnea HPI: 63 year old female with sleep apnea who has been unable to tolerate CPAP.  Past Medical History:  Diagnosis Date   Cellulitis    Left leg   Dysrhythmia    Palpitations   Fibromyalgia    GERD (gastroesophageal reflux disease)    Hypertension    IBS (irritable bowel syndrome)    Overactive bladder    Sleep apnea    no cpap machine    Past Surgical History:  Procedure Laterality Date   DILATION AND CURETTAGE OF UTERUS     DRUG INDUCED ENDOSCOPY N/A 08/24/2023   Procedure: DRUG INDUCED SLEEP ENDOSCOPY;  Surgeon: Christia Reading, MD;  Location: [facility];  Service: ENT;  Laterality: N/A;   FRACTURE SURGERY     right hand   LAPAROSCOPIC ASSISTED VAGINAL HYSTERECTOMY N/A 10/25/2013   Procedure: LAPAROSCOPIC ASSISTED VAGINAL HYSTERECTOMY; BSO;  Surgeon: Alphonsus Sias. Ernestina Penna, MD;  Location: [facility];  Service: Gynecology;  Laterality: N/A;   NOVASURE ABLATION     POLYPECTOMY     uterine   ROBOTIC ASSISTED TOTAL HYSTERECTOMY WITH BILATERAL SALPINGO OOPHERECTOMY Bilateral 10/25/2013   Procedure: ROBOTIC ASSISTED TOTAL HYSTERECTOMY WITH BILATERAL SALPINGO OOPHORECTOMY;  Surgeon: Alphonsus Sias. Ernestina Penna, MD;  Location: [facility];  Service: Gynecology;  Laterality: Bilateral;  3 1/2 hrs.   VEIN SURGERY     Left leg    History reviewed. No pertinent family history. Social History:  reports that she has never smoked. She does not have any smokeless tobacco history on file. She reports that she does not drink alcohol and does not use drugs.  Allergies:  Allergies  Allergen Reactions   Rocephin [Ceftriaxone Sodium In Dextrose] Hives and Rash    She thinks it is Rocephin   Isosorbide Other (See Comments)    headache    Medications Prior to Admission  Medication Sig Dispense Refill   aspirin EC 81 MG tablet Take 81 mg by mouth daily. Swallow whole.     buPROPion (WELLBUTRIN XL) 150 MG 24 hr tablet Take 150 mg by  mouth daily.     metoprolol succinate (TOPROL-XL) 25 MG 24 hr tablet Take 25 mg by mouth daily.     oxybutynin (DITROPAN) 5 MG tablet Take 5 mg by mouth 2 (two) times daily.     Pantoprazole Sodium (PROTONIX PO) Take by mouth.     PREDNISONE, PAK, PO Take by mouth.     ranolazine (RANEXA) 500 MG 12 hr tablet Take 500 mg by mouth 2 (two) times daily.     rosuvastatin (CRESTOR) 10 MG tablet Take 10 mg by mouth daily.     traMADol (ULTRAM) 50 MG tablet Take 100 mg by mouth 3 (three) times daily as needed for moderate pain (fibromyalgia).     VITAMIN D PO Take by mouth.     acetaminophen (TYLENOL) 500 MG tablet Take 1,000 mg by mouth daily as needed for mild pain.     dicyclomine (BENTYL) 20 MG tablet Take 20 mg by mouth 2 (two) times daily.      No results found for this or any previous visit (from the past 48 hours). No results found.  Review of Systems  All other systems reviewed and are negative.   Blood pressure (!) 151/90, pulse 70, temperature (!) 97 F (36.1 C), temperature source Temporal, height 5\' 3"  (1.6 m), weight 91.4 kg, SpO2 100%. Physical Exam Constitutional:  Appearance: Normal appearance. She is normal weight.  HENT:     Head: Normocephalic and atraumatic.     Right Ear: External ear normal.     Left Ear: External ear normal.     Nose: Nose normal.     Mouth/Throat:     Mouth: Mucous membranes are moist.     Pharynx: Oropharynx is clear.  Eyes:     Extraocular Movements: Extraocular movements intact.     Pupils: Pupils are equal, round, and reactive to light.  Cardiovascular:     Rate and Rhythm: Normal rate.  Pulmonary:     Effort: Pulmonary effort is normal.  Skin:    General: Skin is warm and dry.  Neurological:     General: No focal deficit present.     Mental Status: She is alert and oriented to person, place, and time.  Psychiatric:        Mood and Affect: Mood normal.        Behavior: Behavior normal.        Thought Content: Thought content  normal.        Judgment: Judgment normal.      Assessment/Plan Obstructive sleep apnea and BMI 35.69  To OR for hypoglossal nerve stimulator placement.  Christia Reading, MD 01/25/2024, 11:54 AM

## 2024-01-25 NOTE — Transfer of Care (Signed)
 Immediate Anesthesia Transfer of Care Note  Patient: Lisa Braun  Procedure(s) Performed: INSERTION, HYPOGLOSSAL NERVE STIMULATOR (Bilateral: Neck)  Patient Location: PACU  Anesthesia Type:General  Level of Consciousness: drowsy  Airway & Oxygen Therapy: Patient Spontanous Breathing and Patient connected to face mask oxygen  Post-op Assessment: Report given to RN and Post -op Vital signs reviewed and stable  Post vital signs: Reviewed and stable  Last Vitals:  Vitals Value Taken Time  BP    Temp 36.3 C 01/25/24 1415  Pulse    Resp    SpO2      Last Pain:  Vitals:   01/25/24 1042  TempSrc: Temporal  PainSc: 0-No pain      Patients Stated Pain Goal: 3 (01/25/24 1042)  Complications: No notable events documented.

## 2024-01-25 NOTE — Discharge Instructions (Addendum)
  Post Anesthesia Home Care Instructions  Activity: Next tylenol dose after 6:45pm Get plenty of rest for the remainder of the day. A responsible individual must stay with you for 24 hours following the procedure.  For the next 24 hours, DO NOT: -Drive a car -Advertising copywriter -Drink alcoholic beverages -Take any medication unless instructed by your physician -Make any legal decisions or sign important papers.  Meals: Start with liquid foods such as gelatin or soup. Progress to regular foods as tolerated. Avoid greasy, spicy, heavy foods. If nausea and/or vomiting occur, drink only clear liquids until the nausea and/or vomiting subsides. Call your physician if vomiting continues.  Special Instructions/Symptoms: Your throat may feel dry or sore from the anesthesia or the breathing tube placed in your throat during surgery. If this causes discomfort, gargle with warm salt water. The discomfort should disappear within 24 hours.  If you had a scopolamine patch placed behind your ear for the management of post- operative nausea and/or vomiting:  1. The medication in the patch is effective for 72 hours, after which it should be removed.  Wrap patch in a tissue and discard in the trash. Wash hands thoroughly with soap and water. 2. You may remove the patch earlier than 72 hours if you experience unpleasant side effects which may include dry mouth, dizziness or visual disturbances. 3. Avoid touching the patch. Wash your hands with soap and water after contact with the patch.    Post Anesthesia Home Care Instructions  Activity: Get plenty of rest for the remainder of the day. A responsible individual must stay with you for 24 hours following the procedure.  For the next 24 hours, DO NOT: -Drive a car -Advertising copywriter -Drink alcoholic beverages -Take any medication unless instructed by your physician -Make any legal decisions or sign important papers.  Meals: Start with liquid foods such  as gelatin or soup. Progress to regular foods as tolerated. Avoid greasy, spicy, heavy foods. If nausea and/or vomiting occur, drink only clear liquids until the nausea and/or vomiting subsides. Call your physician if vomiting continues.  Special Instructions/Symptoms: Your throat may feel dry or sore from the anesthesia or the breathing tube placed in your throat during surgery. If this causes discomfort, gargle with warm salt water. The discomfort should disappear within 24 hours.  If you had a scopolamine patch placed behind your ear for the management of post- operative nausea and/or vomiting:  1. The medication in the patch is effective for 72 hours, after which it should be removed.  Wrap patch in a tissue and discard in the trash. Wash hands thoroughly with soap and water. 2. You may remove the patch earlier than 72 hours if you experience unpleasant side effects which may include dry mouth, dizziness or visual disturbances. 3. Avoid touching the patch. Wash your hands with soap and water after contact with the patch.

## 2024-01-25 NOTE — Anesthesia Procedure Notes (Signed)
 Procedure Name: Intubation Date/Time: 01/25/2024 12:39 PM  Performed by: Roosvelt Harps, CRNAPre-anesthesia Checklist: Patient identified, Emergency Drugs available, Suction available and Patient being monitored Patient Re-evaluated:Patient Re-evaluated prior to induction Oxygen Delivery Method: Circle System Utilized Preoxygenation: Pre-oxygenation with 100% oxygen Induction Type: IV induction Ventilation: Mask ventilation without difficulty Laryngoscope Size: Mac and 3 Grade View: Grade I Tube type: Oral Tube size: 7.0 mm Number of attempts: 1 Airway Equipment and Method: Stylet and Oral airway Placement Confirmation: ETT inserted through vocal cords under direct vision, positive ETCO2 and breath sounds checked- equal and bilateral Secured at: 21 cm Tube secured with: Tape Dental Injury: Teeth and Oropharynx as per pre-operative assessment

## 2024-01-25 NOTE — Op Note (Signed)

## 2024-01-25 NOTE — Anesthesia Postprocedure Evaluation (Signed)
 Anesthesia Post Note  Patient: Lisa Braun  Procedure(s) Performed: INSERTION, HYPOGLOSSAL NERVE STIMULATOR (Bilateral: Neck)     Patient location during evaluation: PACU Anesthesia Type: General Level of consciousness: sedated and patient cooperative Pain management: pain level controlled Vital Signs Assessment: post-procedure vital signs reviewed and stable Respiratory status: spontaneous breathing Cardiovascular status: stable Anesthetic complications: no   No notable events documented.  Last Vitals:  Vitals:   01/25/24 1430 01/25/24 1445  BP: 116/61   Pulse: 79   Resp: 15   Temp:    SpO2: 92% 92%    Last Pain:  Vitals:   01/25/24 1415  TempSrc:   PainSc: 0-No pain                 Lewie Loron

## 2024-01-26 ENCOUNTER — Encounter (HOSPITAL_BASED_OUTPATIENT_CLINIC_OR_DEPARTMENT_OTHER): Payer: Self-pay | Admitting: Otolaryngology
